# Patient Record
Sex: Female | Born: 1995 | Race: Black or African American | Hispanic: No | Marital: Single | Smoking: Current every day smoker
Health system: Southern US, Community
[De-identification: ages and names within clinical notes are randomized; demographics above are authoritative.]

## PROBLEM LIST (undated history)

## (undated) ENCOUNTER — Inpatient Hospital Stay (HOSPITAL_COMMUNITY): Payer: Self-pay

## (undated) DIAGNOSIS — R7303 Prediabetes: Secondary | ICD-10-CM

## (undated) DIAGNOSIS — K219 Gastro-esophageal reflux disease without esophagitis: Secondary | ICD-10-CM

## (undated) DIAGNOSIS — I10 Essential (primary) hypertension: Secondary | ICD-10-CM

## (undated) HISTORY — PX: NO PAST SURGERIES: SHX2092

---
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
## 2014-05-18 ENCOUNTER — Encounter (HOSPITAL_BASED_OUTPATIENT_CLINIC_OR_DEPARTMENT_OTHER): Payer: Self-pay | Admitting: *Deleted

## 2014-05-18 ENCOUNTER — Emergency Department (HOSPITAL_BASED_OUTPATIENT_CLINIC_OR_DEPARTMENT_OTHER)
Admission: EM | Admit: 2014-05-18 | Discharge: 2014-05-18 | Disposition: A | Discharge status: Home / Self Care | Payer: Medicaid Other | Attending: Emergency Medicine | Admitting: Emergency Medicine

## 2014-05-18 DIAGNOSIS — Z720 Tobacco use: Secondary | ICD-10-CM | POA: Insufficient documentation

## 2014-05-18 DIAGNOSIS — Z3202 Encounter for pregnancy test, result negative: Secondary | ICD-10-CM | POA: Insufficient documentation

## 2014-05-18 DIAGNOSIS — Z8719 Personal history of other diseases of the digestive system: Secondary | ICD-10-CM | POA: Insufficient documentation

## 2014-05-18 DIAGNOSIS — R197 Diarrhea, unspecified: Secondary | ICD-10-CM | POA: Insufficient documentation

## 2014-05-18 DIAGNOSIS — R1013 Epigastric pain: Secondary | ICD-10-CM | POA: Diagnosis not present

## 2014-05-18 DIAGNOSIS — R112 Nausea with vomiting, unspecified: Secondary | ICD-10-CM | POA: Insufficient documentation

## 2014-05-18 HISTORY — DX: Gastro-esophageal reflux disease without esophagitis: K21.9

## 2014-05-18 LAB — URINALYSIS, ROUTINE W REFLEX MICROSCOPIC
GLUCOSE, UA: NEGATIVE mg/dL
Hgb urine dipstick: NEGATIVE
KETONES UR: 15 mg/dL — AB
LEUKOCYTES UA: NEGATIVE
NITRITE: NEGATIVE
Protein, ur: NEGATIVE mg/dL
Specific Gravity, Urine: 1.031 — ABNORMAL HIGH (ref 1.005–1.030)
Urobilinogen, UA: 1 mg/dL (ref 0.0–1.0)
pH: 5.5 (ref 5.0–8.0)

## 2014-05-18 LAB — PREGNANCY, URINE: PREG TEST UR: NEGATIVE

## 2014-05-18 LAB — URINE MICROSCOPIC-ADD ON

## 2014-05-18 MED ORDER — GI COCKTAIL ~~LOC~~
30.0000 mL | Freq: Once | ORAL | Status: AC
  Administered 2014-05-18: 30 mL via ORAL
  Filled 2014-05-18: qty 30

## 2014-05-18 MED ORDER — ONDANSETRON 4 MG PO TBDP
4.0000 mg | ORAL_TABLET | Freq: Once | ORAL | Status: AC
  Administered 2014-05-18: 4 mg via ORAL
  Filled 2014-05-18: qty 1

## 2014-05-18 MED ORDER — LOPERAMIDE HCL 2 MG PO CAPS: 2.0000 mg | ORAL_CAPSULE | Freq: Four times a day (QID) | ORAL | Status: DC | PRN

## 2014-05-18 MED ORDER — ONDANSETRON 4 MG PO TBDP: 4.0000 mg | ORAL_TABLET | Freq: Three times a day (TID) | ORAL | Status: DC | PRN

## 2014-05-18 NOTE — ED Provider Notes (Signed)
TIME SEEN: 10:00 AM  CHIEF COMPLAINT: Nausea, vomiting, diarrhea  HPI: Pt is a 19 y.o. female with history of GERD who presents to the emergency department with nausea, vomiting and diarrhea that started last night. She states her nieces similar symptoms. Has had some mild epigastric discomfort. No fevers or chills. No bloody stool or melena. No dysuria or hematuria. No vaginal bleeding or discharge. No history of abdominal surgery. No recent travel. No recent antibiotic use.  ROS: See HPI Constitutional: no fever  Eyes: no drainage  ENT: no runny nose   Cardiovascular:  no chest pain  Resp: no SOB  GI: vomiting GU: no dysuria Integumentary: no rash  Allergy: no hives  Musculoskeletal: no leg swelling  Neurological: no slurred speech ROS otherwise negative  PAST MEDICAL HISTORY/PAST SURGICAL HISTORY:  Past Medical History  Diagnosis Date  . GERD (gastroesophageal reflux disease)     MEDICATIONS:  Prior to Admission medications   Not on File    ALLERGIES:  No Known Allergies  SOCIAL HISTORY:  History  Substance Use Topics  . Smoking status: Current Every Day Smoker  . Smokeless tobacco: Not on file  . Alcohol Use: No    FAMILY HISTORY: No family history on file.  EXAM: BP 122/99 mmHg  Pulse 109  Temp(Src) 98.2 F (36.8 C) (Oral)  Resp 16  Ht 5\' 3"  (1.6 m)  Wt 211 lb (95.709 kg)  BMI 37.39 kg/m2  SpO2 99%  LMP 04/24/2014 CONSTITUTIONAL: Alert and oriented and responds appropriately to questions. Well-appearing; well-nourished HEAD: Normocephalic EYES: Conjunctivae clear, PERRL ENT: normal nose; no rhinorrhea; moist mucous membranes; pharynx without lesions noted NECK: Supple, no meningismus, no LAD  CARD: RRR; S1 and S2 appreciated; no murmurs, no clicks, no rubs, no gallops RESP: Normal chest excursion without splinting or tachypnea; breath sounds clear and equal bilaterally; no wheezes, no rhonchi, no rales ABD/GI: Normal bowel sounds; non-distended;  soft, non-tender, no rebound, no guarding BACK:  The back appears normal and is non-tender to palpation, there is no CVA tenderness EXT: Normal ROM in all joints; non-tender to palpation; no edema; normal capillary refill; no cyanosis    SKIN: Normal color for age and race; warm NEURO: Moves all extremities equally PSYCH: The patient's mood and manner are appropriate. Grooming and personal hygiene are appropriate.  MEDICAL DECISION MAKING: Patient here with likely viral illness complicated with GERD. On my evaluation heart rate is in the 90s. She is very well-appearing, nontoxic, well-hydrated. We'll give GI cocktail, Zofran. We'll by mouth challenge.  ED PROGRESS: Patient has tolerated by mouth. Heart rate has improved. Urine shows small ketones but she is drinking fluids without difficulty. Urine pregnancy negative. Will discharge home.     Layla MawKristen N Rayman Petrosian, DO 05/18/14 1049

## 2014-05-18 NOTE — ED Notes (Signed)
Pt c/o generalized abd pain with N/V/D that began last night. Pt has been around family member with stomach virus.

## 2014-05-18 NOTE — Discharge Instructions (Signed)

## 2014-10-21 ENCOUNTER — Emergency Department (HOSPITAL_BASED_OUTPATIENT_CLINIC_OR_DEPARTMENT_OTHER)
Admission: EM | Admit: 2014-10-21 | Discharge: 2014-10-21 | Disposition: A | Discharge status: Home / Self Care | Payer: Medicaid Other | Attending: Emergency Medicine | Admitting: Emergency Medicine

## 2014-10-21 ENCOUNTER — Encounter (HOSPITAL_BASED_OUTPATIENT_CLINIC_OR_DEPARTMENT_OTHER): Payer: Self-pay | Admitting: *Deleted

## 2014-10-21 DIAGNOSIS — I10 Essential (primary) hypertension: Secondary | ICD-10-CM | POA: Diagnosis not present

## 2014-10-21 DIAGNOSIS — R300 Dysuria: Secondary | ICD-10-CM | POA: Diagnosis not present

## 2014-10-21 DIAGNOSIS — Z8719 Personal history of other diseases of the digestive system: Secondary | ICD-10-CM | POA: Insufficient documentation

## 2014-10-21 DIAGNOSIS — Z3202 Encounter for pregnancy test, result negative: Secondary | ICD-10-CM | POA: Diagnosis not present

## 2014-10-21 DIAGNOSIS — R0981 Nasal congestion: Secondary | ICD-10-CM | POA: Diagnosis present

## 2014-10-21 DIAGNOSIS — H9210 Otorrhea, unspecified ear: Secondary | ICD-10-CM | POA: Insufficient documentation

## 2014-10-21 DIAGNOSIS — Z720 Tobacco use: Secondary | ICD-10-CM | POA: Diagnosis not present

## 2014-10-21 DIAGNOSIS — J0100 Acute maxillary sinusitis, unspecified: Secondary | ICD-10-CM | POA: Diagnosis not present

## 2014-10-21 HISTORY — DX: Essential (primary) hypertension: I10

## 2014-10-21 HISTORY — DX: Prediabetes: R73.03

## 2014-10-21 LAB — URINE MICROSCOPIC-ADD ON

## 2014-10-21 LAB — URINALYSIS, ROUTINE W REFLEX MICROSCOPIC
BILIRUBIN URINE: NEGATIVE
Glucose, UA: NEGATIVE mg/dL
HGB URINE DIPSTICK: NEGATIVE
Ketones, ur: 15 mg/dL — AB
NITRITE: NEGATIVE
Protein, ur: NEGATIVE mg/dL
Specific Gravity, Urine: 1.027 (ref 1.005–1.030)
Urobilinogen, UA: 0.2 mg/dL (ref 0.0–1.0)
pH: 5.5 (ref 5.0–8.0)

## 2014-10-21 LAB — PREGNANCY, URINE: Preg Test, Ur: NEGATIVE

## 2014-10-21 MED ORDER — AMOXICILLIN 500 MG PO CAPS: 500.0000 mg | ORAL_CAPSULE | Freq: Three times a day (TID) | ORAL | Status: AC

## 2014-10-21 MED ORDER — ACETAMINOPHEN 325 MG PO TABS
650.0000 mg | ORAL_TABLET | Freq: Once | ORAL | Status: AC
  Administered 2014-10-21: 650 mg via ORAL
  Filled 2014-10-21: qty 2

## 2014-10-21 NOTE — ED Notes (Signed)
Pt reports nasal congestion since Wednesday; denies cough, sore throat, v/d. Reports nausea, headache and intermittent runny nose. Reports taking Motrin 400 mg for pain with no relief (last taken on Friday). Also reports dysuria and frequency (denies hematuria).

## 2014-10-21 NOTE — ED Notes (Signed)
Pt states she thinks she has a sinus infection. HA, congestion, no cough, onset this past Tuesday, pt states also having fever

## 2014-10-21 NOTE — Discharge Instructions (Signed)

## 2014-10-21 NOTE — ED Provider Notes (Signed)
CSN: [id number]     Arrival date & time 10/21/14  1637 History   First MD Initiated Contact with Patient 10/21/14 1706     Chief Complaint  Patient presents with  . Nasal Congestion  . Dysuria     (Consider location/radiation/quality/duration/timing/severity/associated sxs/prior Treatment) Patient is a 19 y.o. female presenting with cough. The history is provided by the patient. No language interpreter was used.  Cough Cough characteristics:  Non-productive Severity:  Moderate Onset quality:  Gradual Duration:  3 days Timing:  Constant Progression:  Worsening Chronicity:  New Smoker: no   Context: upper respiratory infection   Relieved by:  Nothing Worsened by:  Nothing tried Ineffective treatments:  None tried Associated symptoms: headaches and rhinorrhea   Associated symptoms: no shortness of breath     Past Medical History  Diagnosis Date  . GERD (gastroesophageal reflux disease)   . Hypertension     states this has resolved  . Prediabetes     states this has resolved   History reviewed. No pertinent past surgical history. No family history on file. History  Substance Use Topics  . Smoking status: Current Every Day Smoker -- 0.33 packs/day    Types: Cigarettes  . Smokeless tobacco: Never Used  . Alcohol Use: No   OB History    No data available     Review of Systems  HENT: Positive for ear discharge, facial swelling and rhinorrhea.   Respiratory: Positive for cough. Negative for shortness of breath.   Neurological: Positive for headaches.  All other systems reviewed and are negative.     Allergies  Review of patient's allergies indicates no known allergies.  Home Medications   Prior to Admission medications   Medication Sig Start Date End Date Taking? Authorizing Provider  loperamide (IMODIUM) 2 MG capsule Take 1 capsule (2 mg total) by mouth 4 (four) times daily as needed for diarrhea or loose stools. 05/18/14  Yes Kristen N Ward, DO  ondansetron  (ZOFRAN ODT) 4 MG disintegrating tablet Take 1 tablet (4 mg total) by mouth every 8 (eight) hours as needed for nausea or vomiting. 05/18/14   Kristen N Ward, DO   BP 116/61 mmHg  Pulse 127  Temp(Src) 103.1 F (39.5 C) (Oral)  Resp 20  Ht 5\' 3"  (1.6 m)  Wt 200 lb (90.719 kg)  BMI 35.44 kg/m2  SpO2 100%  LMP 10/16/2014 Physical Exam  Constitutional: She appears well-developed and well-nourished.  Cardiovascular: Normal rate.   Pulmonary/Chest: Effort normal.  Abdominal: Soft.  Musculoskeletal: Normal range of motion.  Neurological: She is alert.  Skin: Skin is warm.  Psychiatric: She has a normal mood and affect.  Vitals reviewed.   ED Course  Procedures (including critical care time) Labs Review Labs Reviewed  URINALYSIS, ROUTINE W REFLEX MICROSCOPIC (NOT AT [facility])  PREGNANCY, URINE    Imaging Review No results found.   EKG Interpretation None      MDM   Final diagnoses:  Acute maxillary sinusitis, recurrence not specified   Temp decreased, urine is negative  amoxicillian Follow up with your primary for recheck in 2-3 days   Elson AreasLeslie K Tinika Bucknam, PA-C 10/21/14 1920  Gwyneth SproutWhitney Plunkett, MD 10/21/14 2337

## 2015-03-21 ENCOUNTER — Emergency Department (HOSPITAL_BASED_OUTPATIENT_CLINIC_OR_DEPARTMENT_OTHER)
Admission: EM | Admit: 2015-03-21 | Discharge: 2015-03-21 | Disposition: A | Discharge status: Home / Self Care | Payer: Medicaid Other | Attending: Emergency Medicine | Admitting: Emergency Medicine

## 2015-03-21 ENCOUNTER — Encounter (HOSPITAL_BASED_OUTPATIENT_CLINIC_OR_DEPARTMENT_OTHER): Payer: Self-pay | Admitting: Emergency Medicine

## 2015-03-21 DIAGNOSIS — O210 Mild hyperemesis gravidarum: Secondary | ICD-10-CM | POA: Insufficient documentation

## 2015-03-21 DIAGNOSIS — O9989 Other specified diseases and conditions complicating pregnancy, childbirth and the puerperium: Secondary | ICD-10-CM | POA: Insufficient documentation

## 2015-03-21 DIAGNOSIS — Z79899 Other long term (current) drug therapy: Secondary | ICD-10-CM | POA: Insufficient documentation

## 2015-03-21 DIAGNOSIS — R1013 Epigastric pain: Secondary | ICD-10-CM | POA: Diagnosis not present

## 2015-03-21 DIAGNOSIS — Z8719 Personal history of other diseases of the digestive system: Secondary | ICD-10-CM | POA: Insufficient documentation

## 2015-03-21 DIAGNOSIS — Z3A09 9 weeks gestation of pregnancy: Secondary | ICD-10-CM | POA: Diagnosis not present

## 2015-03-21 DIAGNOSIS — Z87891 Personal history of nicotine dependence: Secondary | ICD-10-CM | POA: Diagnosis not present

## 2015-03-21 DIAGNOSIS — Z3490 Encounter for supervision of normal pregnancy, unspecified, unspecified trimester: Secondary | ICD-10-CM

## 2015-03-21 LAB — URINALYSIS, ROUTINE W REFLEX MICROSCOPIC
BILIRUBIN URINE: NEGATIVE
Glucose, UA: NEGATIVE mg/dL
Hgb urine dipstick: NEGATIVE
Ketones, ur: NEGATIVE mg/dL
NITRITE: NEGATIVE
PH: 7 (ref 5.0–8.0)
Protein, ur: NEGATIVE mg/dL
SPECIFIC GRAVITY, URINE: 1.026 (ref 1.005–1.030)

## 2015-03-21 LAB — COMPREHENSIVE METABOLIC PANEL
ALBUMIN: 3.9 g/dL (ref 3.5–5.0)
ALK PHOS: 50 U/L (ref 38–126)
ALT: 13 U/L — ABNORMAL LOW (ref 14–54)
ANION GAP: 6 (ref 5–15)
AST: 17 U/L (ref 15–41)
BUN: 8 mg/dL (ref 6–20)
CALCIUM: 9.2 mg/dL (ref 8.9–10.3)
CHLORIDE: 105 mmol/L (ref 101–111)
CO2: 23 mmol/L (ref 22–32)
Creatinine, Ser: 0.53 mg/dL (ref 0.44–1.00)
GFR calc Af Amer: >60 mL/min (ref >60)
GFR calc non Af Amer: >60 mL/min (ref >60)
GLUCOSE: 97 mg/dL (ref 65–99)
Potassium: 3.8 mmol/L (ref 3.5–5.1)
SODIUM: 134 mmol/L — AB (ref 135–145)
Total Bilirubin: 0.5 mg/dL (ref 0.3–1.2)
Total Protein: 6.8 g/dL (ref 6.5–8.1)

## 2015-03-21 LAB — URINE MICROSCOPIC-ADD ON: RBC / HPF: NONE SEEN RBC/hpf (ref 0–5)

## 2015-03-21 LAB — CBC WITH DIFFERENTIAL/PLATELET
BASOS PCT: 0 %
Basophils Absolute: 0 10*3/uL (ref 0.0–0.1)
EOS ABS: 0.2 10*3/uL (ref 0.0–0.7)
Eosinophils Relative: 1 %
HCT: 35.3 % — ABNORMAL LOW (ref 36.0–46.0)
HEMOGLOBIN: 12.7 g/dL (ref 12.0–15.0)
Lymphocytes Relative: 24 %
Lymphs Abs: 3 10*3/uL (ref 0.7–4.0)
MCH: 29.7 pg (ref 26.0–34.0)
MCHC: 36 g/dL (ref 30.0–36.0)
MCV: 82.7 fL (ref 78.0–100.0)
Monocytes Absolute: 0.9 10*3/uL (ref 0.1–1.0)
Monocytes Relative: 7 %
NEUTROS PCT: 68 %
Neutro Abs: 8.4 10*3/uL — ABNORMAL HIGH (ref 1.7–7.7)
Platelets: 346 10*3/uL (ref 150–400)
RBC: 4.27 MIL/uL (ref 3.87–5.11)
RDW: 11.8 % (ref 11.5–15.5)
WBC: 12.5 10*3/uL — AB (ref 4.0–10.5)

## 2015-03-21 LAB — PREGNANCY, URINE: PREG TEST UR: POSITIVE — AB

## 2015-03-21 LAB — LIPASE, BLOOD: Lipase: 38 U/L (ref 11–51)

## 2015-03-21 MED ORDER — PANTOPRAZOLE SODIUM 40 MG PO TBEC: 40.0000 mg | DELAYED_RELEASE_TABLET | Freq: Every day | ORAL | Status: DC

## 2015-03-21 MED ORDER — PANTOPRAZOLE SODIUM 40 MG PO TBEC
40.0000 mg | DELAYED_RELEASE_TABLET | Freq: Every day | ORAL | Status: DC
  Administered 2015-03-21: 40 mg via ORAL

## 2015-03-21 MED ORDER — PANTOPRAZOLE SODIUM 40 MG PO TBEC
40.0000 mg | DELAYED_RELEASE_TABLET | Freq: Every day | ORAL | Status: DC
  Filled 2015-03-21: qty 1

## 2015-03-21 NOTE — ED Notes (Signed)
Pt is [redacted] weeks pregnant reports epigastric pain that radiates up towards the throat. Says she wants to throw up but can't. A/O NAD at triage.

## 2015-03-21 NOTE — Discharge Instructions (Signed)

## 2015-03-21 NOTE — ED Provider Notes (Signed)
CSN: [id number]     Arrival date & time 03/21/15  0002 History   First MD Initiated Contact with Patient 03/21/15 0016     Chief Complaint  Patient presents with  . Gastroesophageal Reflux     (Consider location/radiation/quality/duration/timing/severity/associated sxs/prior Treatment) HPI  This is a 19 year old G1 P0 female who presents with epigastric pain. Patient reports onset of epigastric pain after eating dinner tonight.  It is sharp and nonradiating. She currently rates her pain at 8 out of 10. She's not taken anything for the pain. She has a history of reflux but feels this is somewhat different. She is currently pregnant. She is followed by her OB and reports normal ultrasound. Denies any lower abdominal pain, loss of fluids, vaginal bleeding. Patient reports daily nausea and vomiting. She's not taking anything for this.  Past Medical History  Diagnosis Date  . GERD (gastroesophageal reflux disease)   . Hypertension     states this has resolved  . Prediabetes     states this has resolved   History reviewed. No pertinent past surgical history. No family history on file. Social History  Substance Use Topics  . Smoking status: Former Smoker -- 0.33 packs/day    Types: Cigarettes  . Smokeless tobacco: Never Used  . Alcohol Use: No   OB History    Gravida Para Term Preterm AB TAB SAB Ectopic Multiple Living   1              Review of Systems  Constitutional: Negative for fever.  Respiratory: Negative for chest tightness and shortness of breath.   Cardiovascular: Negative for chest pain.  Gastrointestinal: Positive for nausea, vomiting and abdominal pain. Negative for diarrhea.  Genitourinary: Negative for dysuria and vaginal bleeding.  Psychiatric/Behavioral: Negative for confusion.  All other systems reviewed and are negative.     Allergies  Review of patient's allergies indicates no known allergies.  Home Medications   Prior to Admission medications    Medication Sig Start Date End Date Taking? Authorizing Provider  Prenatal Vit-Fe Fumarate-FA (PRENATAL MULTIVITAMIN) TABS tablet Take 1 tablet by mouth daily at 12 noon.   Yes Historical Provider, MD  loperamide (IMODIUM) 2 MG capsule Take 1 capsule (2 mg total) by mouth 4 (four) times daily as needed for diarrhea or loose stools. 05/18/14   Kristen N Ward, DO  ondansetron (ZOFRAN ODT) 4 MG disintegrating tablet Take 1 tablet (4 mg total) by mouth every 8 (eight) hours as needed for nausea or vomiting. 05/18/14   Kristen N Ward, DO  pantoprazole (PROTONIX) 40 MG tablet Take 1 tablet (40 mg total) by mouth daily. 03/21/15   Shon Batonourtney F Morgane Joerger, MD   BP 114/61 mmHg  Pulse 75  Temp(Src) 98 F (36.7 C) (Oral)  Resp 16  Ht 5\' 3"  (1.6 m)  Wt 209 lb (94.802 kg)  BMI 37.03 kg/m2  SpO2 100%  LMP 10/16/2014 Physical Exam  Constitutional: She is oriented to person, place, and time. She appears well-developed and well-nourished. No distress.  HENT:  Head: Normocephalic and atraumatic.  Eyes: Pupils are equal, round, and reactive to light.  Cardiovascular: Normal rate, regular rhythm and normal heart sounds.   No murmur heard. Pulmonary/Chest: Effort normal and breath sounds normal. No respiratory distress. She has no wheezes.  Abdominal: Soft. Bowel sounds are normal. There is tenderness. There is no rebound and no guarding.  Tenderness to palpation in the epigastrium without rebound or guarding  Neurological: She is alert and oriented to person,  place, and time.  Skin: Skin is warm and dry.  Psychiatric: She has a normal mood and affect.  Nursing note and vitals reviewed.   ED Course  Procedures (including critical care time) Labs Review Labs Reviewed  CBC WITH DIFFERENTIAL/PLATELET - Abnormal; Notable for the following:    WBC 12.5 (*)    HCT 35.3 (*)    Neutro Abs 8.4 (*)    All other components within normal limits  COMPREHENSIVE METABOLIC PANEL - Abnormal; Notable for the following:     Sodium 134 (*)    ALT 13 (*)    All other components within normal limits  URINALYSIS, ROUTINE W REFLEX MICROSCOPIC (NOT AT [facility]) - Abnormal; Notable for the following:    APPearance CLOUDY (*)    Leukocytes, UA TRACE (*)    All other components within normal limits  PREGNANCY, URINE - Abnormal; Notable for the following:    Preg Test, Ur POSITIVE (*)    All other components within normal limits  URINE MICROSCOPIC-ADD ON - Abnormal; Notable for the following:    Squamous Epithelial / LPF 6-30 (*)    Bacteria, UA MANY (*)    All other components within normal limits  LIPASE, BLOOD    Imaging Review No results found. I have personally reviewed and evaluated these images and lab results as part of my medical decision-making.   EKG Interpretation None      MDM   Final diagnoses:  Pregnancy  Epigastric pain    Patient presents with epigastric pain. Nontoxic on exam. Onset after eating. Considerations include GERD or gallbladder pathology. Basic labwork obtained and patient given Protonix. Patient denies any lower abdominal pain or vaginal bleeding.  Lab work is reassuring. No elevation of LFTs and lipase is normal. Patient was improved with Protonix. Patient does have 0-5 white cells and many bacteria in her urine; however, she is asymptomatic and the specimen appears dirty. We'll have her follow-up with her OB. Suspect GERD.  Will start on Protonix.  After history, exam, and medical workup I feel the patient has been appropriately medically screened and is safe for discharge home. Pertinent diagnoses were discussed with the patient. Patient was given return precautions.     Shon Baton, MD 03/21/15 [phone]

## 2015-08-16 ENCOUNTER — Inpatient Hospital Stay (HOSPITAL_BASED_OUTPATIENT_CLINIC_OR_DEPARTMENT_OTHER)
Admission: EM | Admit: 2015-08-16 | Discharge: 2015-08-16 | Disposition: A | Discharge status: Other Acute Inpatient Hospital | Payer: Medicaid Other | Attending: Obstetrics & Gynecology | Admitting: Obstetrics & Gynecology

## 2015-08-16 ENCOUNTER — Encounter (HOSPITAL_BASED_OUTPATIENT_CLINIC_OR_DEPARTMENT_OTHER): Payer: Self-pay | Admitting: *Deleted

## 2015-08-16 DIAGNOSIS — O212 Late vomiting of pregnancy: Secondary | ICD-10-CM | POA: Diagnosis not present

## 2015-08-16 DIAGNOSIS — Z79899 Other long term (current) drug therapy: Secondary | ICD-10-CM | POA: Insufficient documentation

## 2015-08-16 DIAGNOSIS — K529 Noninfective gastroenteritis and colitis, unspecified: Secondary | ICD-10-CM

## 2015-08-16 DIAGNOSIS — O4703 False labor before 37 completed weeks of gestation, third trimester: Secondary | ICD-10-CM

## 2015-08-16 DIAGNOSIS — O98313 Other infections with a predominantly sexual mode of transmission complicating pregnancy, third trimester: Secondary | ICD-10-CM | POA: Diagnosis not present

## 2015-08-16 DIAGNOSIS — Z87891 Personal history of nicotine dependence: Secondary | ICD-10-CM | POA: Insufficient documentation

## 2015-08-16 DIAGNOSIS — Z3A29 29 weeks gestation of pregnancy: Secondary | ICD-10-CM

## 2015-08-16 DIAGNOSIS — A5901 Trichomonal vulvovaginitis: Secondary | ICD-10-CM | POA: Diagnosis not present

## 2015-08-16 DIAGNOSIS — R1084 Generalized abdominal pain: Secondary | ICD-10-CM | POA: Diagnosis present

## 2015-08-16 DIAGNOSIS — O23593 Infection of other part of genital tract in pregnancy, third trimester: Secondary | ICD-10-CM

## 2015-08-16 LAB — CBC WITH DIFFERENTIAL/PLATELET
BASOS ABS: 0 10*3/uL (ref 0.0–0.1)
BASOS PCT: 0 %
EOS PCT: 2 %
Eosinophils Absolute: 0.2 10*3/uL (ref 0.0–0.7)
HEMATOCRIT: 31.6 % — AB (ref 36.0–46.0)
HEMOGLOBIN: 11.5 g/dL — AB (ref 12.0–15.0)
Lymphocytes Relative: 18 %
Lymphs Abs: 2 10*3/uL (ref 0.7–4.0)
MCH: 31.3 pg (ref 26.0–34.0)
MCHC: 36.4 g/dL — ABNORMAL HIGH (ref 30.0–36.0)
MCV: 86.1 fL (ref 78.0–100.0)
MONO ABS: 0.8 10*3/uL (ref 0.1–1.0)
MONOS PCT: 7 %
NEUTROS ABS: 8.2 10*3/uL — AB (ref 1.7–7.7)
Neutrophils Relative %: 73 %
Platelets: 303 10*3/uL (ref 150–400)
RBC: 3.67 MIL/uL — ABNORMAL LOW (ref 3.87–5.11)
RDW: 12.1 % (ref 11.5–15.5)
WBC: 11.1 10*3/uL — ABNORMAL HIGH (ref 4.0–10.5)

## 2015-08-16 LAB — COMPREHENSIVE METABOLIC PANEL WITH GFR
ALT: 10 U/L — ABNORMAL LOW (ref 14–54)
AST: 18 U/L (ref 15–41)
Albumin: 3.3 g/dL — ABNORMAL LOW (ref 3.5–5.0)
Alkaline Phosphatase: 54 U/L (ref 38–126)
Anion gap: 4 — ABNORMAL LOW (ref 5–15)
BUN: 7 mg/dL (ref 6–20)
CO2: 23 mmol/L (ref 22–32)
Calcium: 8.7 mg/dL — ABNORMAL LOW (ref 8.9–10.3)
Chloride: 106 mmol/L (ref 101–111)
Creatinine, Ser: 0.52 mg/dL (ref 0.44–1.00)
GFR calc Af Amer: >60 mL/min
GFR calc non Af Amer: >60 mL/min
Glucose, Bld: 81 mg/dL (ref 65–99)
Potassium: 3.3 mmol/L — ABNORMAL LOW (ref 3.5–5.1)
Sodium: 133 mmol/L — ABNORMAL LOW (ref 135–145)
Total Bilirubin: 0.6 mg/dL (ref 0.3–1.2)
Total Protein: 6.5 g/dL (ref 6.5–8.1)

## 2015-08-16 LAB — URINALYSIS, ROUTINE W REFLEX MICROSCOPIC
Bilirubin Urine: NEGATIVE
Glucose, UA: NEGATIVE mg/dL
Hgb urine dipstick: NEGATIVE
Ketones, ur: 15 mg/dL — AB
Nitrite: NEGATIVE
Protein, ur: NEGATIVE mg/dL
Specific Gravity, Urine: 1.022 (ref 1.005–1.030)
pH: 7 (ref 5.0–8.0)

## 2015-08-16 LAB — URINE MICROSCOPIC-ADD ON

## 2015-08-16 LAB — FETAL FIBRONECTIN: Fetal Fibronectin: NEGATIVE

## 2015-08-16 MED ORDER — NIFEDIPINE 10 MG PO CAPS
10.0000 mg | ORAL_CAPSULE | Freq: Once | ORAL | Status: AC
  Administered 2015-08-16: 10 mg via ORAL
  Filled 2015-08-16: qty 1

## 2015-08-16 MED ORDER — DEXTROSE IN LACTATED RINGERS 5 % IV SOLN
Freq: Once | INTRAVENOUS | Status: AC
  Administered 2015-08-16: 19:00:00 via INTRAVENOUS
  Filled 2015-08-16: qty 10

## 2015-08-16 MED ORDER — METRONIDAZOLE 500 MG PO TABS
2000.0000 mg | ORAL_TABLET | Freq: Once | ORAL | Status: AC
  Administered 2015-08-16: 2000 mg via ORAL
  Filled 2015-08-16: qty 4

## 2015-08-16 MED ORDER — SODIUM CHLORIDE 0.9 % IV BOLUS (SEPSIS)
1000.0000 mL | Freq: Once | INTRAVENOUS | Status: AC
  Administered 2015-08-16: 1000 mL via INTRAVENOUS

## 2015-08-16 NOTE — ED Notes (Signed)
Pt is 30 weeks preg and c/o lower abd pain and back pain with months of n/v.  And dizziness

## 2015-08-16 NOTE — MAU Provider Note (Signed)
Chief Complaint:  Abdominal Pain   First Provider Initiated Contact with Patient 08/16/15 1758     HPI  Courtney Vargas is a 20 y.o. G1P0 at [id number] presents to maternity admissions reporting contractions and possible leaking of fluid.  States the cramping started today, but has had some pain in her hips for 2 days.   Has also been vomiting "for months", dizzy "for months", and had diarrhea for 4 days.. She reports good fetal movement, denies vaginal bleeding, vaginal itching/burning, urinary symptoms, h/a, dizziness, diarrhea, constipation or fever/chills.  She denies headache, visual changes or RUQ abdominal pain.  Sees [facility] doctors and [facility], [place].  Has not notified her doctors she is here.  RN Note: [facility] Pt was seen by [facility] RN and noted some U/C's and noted some damp panties ER Note: Courtney Vargas is a 20 y.o. female who presents to the Emergency Department complaining of abdominal pain. She is a G1P0 [redacted] weeks pregnant. [facility] and last appointment was 2 days ago. She reports increased lower abdominal cramping and pressure-type sensation since early this morning. Episodes of cramping last about a couple seconds p.m. and radiate to her low back. She has 2 days of increased pain in her right hip. She denies any fevers, dysuria, vaginal discharge, leakage of fluids. She is feeling the baby move. She has had intermittent diarrhea, nausea, vomiting, itchiness throughout the duration of her pregnancy. She feels increased dizziness today compared to previously. She is being followed for elevated blood pressures but is not currently on any blood pressure medications. She has mild lower extremity swelling.  Past Medical History: Past Medical History  Diagnosis Date  . GERD (gastroesophageal reflux disease)   . Hypertension     states this has  resolved  . Prediabetes     states this has resolved    Past obstetric history: OB History  Gravida Para Term Preterm AB SAB TAB Ectopic Multiple Living  1         0    # Outcome Date GA Lbr Len/2nd Weight Sex Delivery Anes PTL Lv  1 Current               Past Surgical History: Past Surgical History  Procedure Laterality Date  . No past surgeries      Family History: History reviewed. No pertinent family history.  Social History: Social History  Substance Use Topics  . Smoking status: Former Smoker -- 0.33 packs/day    Types: Cigarettes  . Smokeless tobacco: Never Used  . Alcohol Use: No    Allergies: No Known Allergies  Meds:  Prescriptions prior to admission  Medication Sig Dispense Refill Last Dose  . calcium carbonate (TUMS - DOSED IN MG ELEMENTAL CALCIUM) 500 MG chewable tablet Chew 2 tablets by mouth 2 (two) times daily as needed for indigestion or heartburn.   08/15/2015 at Unknown time  . Doxylamine-Pyridoxine (DICLEGIS) 10-10 MG TBEC Take 1-2 tablets by mouth 3 (three) times daily.   08/16/2015 at Unknown time  . ondansetron (ZOFRAN) 8 MG tablet Take 8 mg by mouth every 8 (eight) hours as needed for nausea or vomiting.   08/16/2015 at Unknown time  . pantoprazole (PROTONIX) 40 MG tablet Take 1 tablet (40 mg total) by mouth daily. 30 tablet 0 08/16/2015 at Unknown time  . Prenatal Vit-Fe Fumarate-FA (  PRENATAL MULTIVITAMIN) TABS tablet Take 1 tablet by mouth daily at 12 noon.   08/16/2015 at Unknown time  . ondansetron (ZOFRAN ODT) 4 MG disintegrating tablet Take 1 tablet (4 mg total) by mouth every 8 (eight) hours as needed for nausea or vomiting. (Patient not taking: Reported on 08/16/2015) 20 tablet 0 Not Taking at Unknown time    I have reviewed patient's Past Medical Hx, Surgical Hx, Family Hx, Social Hx, medications and allergies.   ROS:  Review of Systems  Constitutional: Negative for fever, chills and fatigue.  Respiratory: Negative for shortness of breath.    Gastrointestinal: Positive for nausea, vomiting, abdominal pain and diarrhea. Negative for constipation.  Genitourinary: Positive for vaginal discharge and pelvic pain. Negative for dysuria, flank pain, vaginal bleeding and difficulty urinating.  Musculoskeletal: Positive for back pain. Negative for myalgias.  Neurological: Positive for light-headedness. Negative for dizziness, syncope and headaches.   Other systems negative  Physical Exam  Patient Vitals for the past 24 hrs:  BP Temp Temp src Pulse Resp SpO2 Height Weight  08/16/15 1746 121/74 mmHg 98.9 F (37.2 C) Oral 84 18 100 % - -  08/16/15 1530 126/85 mmHg - - - 23 - - -  08/16/15 1419 134/77 mmHg 98.2 F (36.8 C) Oral 87 20 100 %  (1.6 m) 105.235 kg (232 lb)   Constitutional: Well-developed, well-nourished female in no acute distress.  Cardiovascular: normal rate and rhythm Respiratory: normal effort, clear to auscultation bilaterally GI: Abd soft, non-tender, gravid appropriate for gestational age.   No rebound or guarding. MS: Extremities nontender, no edema, normal ROM Neurologic: Alert and oriented x 4.  GU: Neg CVAT.  PELVIC EXAM: Cervix pink, visually closed, without lesion, scant white creamy discharge, vaginal walls and external genitalia normal    No pooling, no ferning  Bimanual exam: Cervix firm, posterior, neg CMT, uterus nontender, Fundal Height consistent with dates, adnexa without tenderness, enlargement, or mass Dilation: Closed Effacement (%): 20 Cervical Position: Posterior Station: Ballotable Presentation: Undeterminable Exam by:: Mayford Knife CNM     FHT:  Baseline 140 , moderate variability, accelerations present, no decelerations Contractions:  Irregular every 3-5min   Labs: Results for orders placed or performed during the hospital encounter of 08/16/15 (from the past 24 hour(s))  CBC with Differential     Status: Abnormal   Collection Time: 08/16/15  3:00 PM  Result Value Ref Range   WBC  11.1 (H) 4.0 - 10.5 K/uL   RBC 3.67 (L) 3.87 - 5.11 MIL/uL   Hemoglobin 11.5 (L) 12.0 - 15.0 g/dL   HCT 16.1 (L) 09.6 - 04.5 %   MCV 86.1 78.0 - 100.0 fL   MCH 31.3 26.0 - 34.0 pg   MCHC 36.4 (H) 30.0 - 36.0 g/dL   RDW 40.9 81.1 - 91.4 %   Platelets 303 150 - 400 K/uL   Neutrophils Relative % 73 %   Neutro Abs 8.2 (H) 1.7 - 7.7 K/uL   Lymphocytes Relative 18 %   Lymphs Abs 2.0 0.7 - 4.0 K/uL   Monocytes Relative 7 %   Monocytes Absolute 0.8 0.1 - 1.0 K/uL   Eosinophils Relative 2 %   Eosinophils Absolute 0.2 0.0 - 0.7 K/uL   Basophils Relative 0 %   Basophils Absolute 0.0 0.0 - 0.1 K/uL  Comprehensive metabolic panel     Status: Abnormal   Collection Time: 08/16/15  3:00 PM  Result Value Ref Range   Sodium 133 (L) 135 - 145 mmol/L  Potassium 3.3 (L) 3.5 - 5.1 mmol/L   Chloride 106 101 - 111 mmol/L   CO2 23 22 - 32 mmol/L   Glucose, Bld 81 65 - 99 mg/dL   BUN 7 6 - 20 mg/dL   Creatinine, Ser 1.61 0.44 - 1.00 mg/dL   Calcium 8.7 (L) 8.9 - 10.3 mg/dL   Total Protein 6.5 6.5 - 8.1 g/dL   Albumin 3.3 (L) 3.5 - 5.0 g/dL   AST 18 15 - 41 U/L   ALT 10 (L) 14 - 54 U/L   Alkaline Phosphatase 54 38 - 126 U/L   Total Bilirubin 0.6 0.3 - 1.2 mg/dL   GFR calc non Af Amer >60 >60 mL/min   GFR calc Af Amer >60 >60 mL/min   Anion gap 4 (L) 5 - 15  Urinalysis, Routine w reflex microscopic (not at [facility])     Status: Abnormal   Collection Time: 08/16/15  4:01 PM  Result Value Ref Range   Color, Urine YELLOW YELLOW   APPearance CLEAR CLEAR   Specific Gravity, Urine 1.022 1.005 - 1.030   pH 7.0 5.0 - 8.0   Glucose, UA NEGATIVE NEGATIVE mg/dL   Hgb urine dipstick NEGATIVE NEGATIVE   Bilirubin Urine NEGATIVE NEGATIVE   Ketones, ur 15 (A) NEGATIVE mg/dL   Protein, ur NEGATIVE NEGATIVE mg/dL   Nitrite NEGATIVE NEGATIVE   Leukocytes, UA SMALL (A) NEGATIVE  Urine microscopic-add on     Status: Abnormal   Collection Time: 08/16/15  4:01 PM  Result Value Ref Range   Squamous Epithelial /  LPF 0-5 (A) NONE SEEN   WBC, UA 0-5 0 - 5 WBC/hpf   RBC / HPF 0-5 0 - 5 RBC/hpf   Bacteria, UA MANY (A) NONE SEEN   Urine-Other TRICHOMONAS PRESENT   Fetal fibronectin     Status: None   Collection Time: 08/16/15  6:40 PM  Result Value Ref Range   Fetal Fibronectin NEGATIVE NEGATIVE    Imaging:  No results found.  MAU Course/MDM: I have ordered labs and reviewed results.  NST reviewed   There are contractions, so will send Fetal fibronectin and give Procardia with UCs diminishing after one dose. Flagyl 2gm given for Trich  Treatments in MAU included IV fluids (MVI infusion), Flagyl for trich, and procardia for preterm contractions  Has had a negative FFn and contractions diminished Tolerating crackers and Flagyl with no vomiting  Assessment: [facility] at [redacted]w[redacted]d  Nausea/vomiting (though none observed) and diarrhea (none observed) Probable gastroenteritis Preterm contractions with neg FFn and no change in cervix, resolved with fluids and Procardia Trichomonas, treated  Plan: Discharge home Partner Rx written Preterm Labor precautions and fetal kick counts Follow up in Office for prenatal visits and recheck as needed Pt to call her doctor tomorrow    Medication List    ASK your doctor about these medications        calcium carbonate 500 MG chewable tablet  Commonly known as:  TUMS - dosed in mg elemental calcium  Chew 2 tablets by mouth 2 (two) times daily as needed for indigestion or heartburn.     DICLEGIS 10-10 MG Tbec  Generic drug:  Doxylamine-Pyridoxine  Take 1-2 tablets by mouth 3 (three) times daily.     ondansetron 4 MG disintegrating tablet  Commonly known as:  ZOFRAN ODT  Take 1 tablet (4 mg total) by mouth every 8 (eight) hours as needed for nausea or vomiting.     ondansetron 8 MG tablet  Commonly known as:  ZOFRAN  Take 8 mg by mouth every 8 (eight) hours as needed for nausea or vomiting.     pantoprazole 40 MG tablet  Commonly known as:  PROTONIX   Take 1 tablet (40 mg total) by mouth daily.     prenatal multivitamin Tabs tablet  Take 1 tablet by mouth daily at 12 noon.       Pt stable at time of discharge.  Wynelle Bourgeois CNM, MSN Certified Nurse-Midwife 08/16/2015 5:58 PM

## 2015-08-16 NOTE — Progress Notes (Signed)
[facility], pt is G1P0 [redacted]weeks pregnant, complaining of dizziness, and pressure in abdomen. Placed on monitor. Will call back after we get tracing.

## 2015-08-16 NOTE — ED Provider Notes (Signed)
CSN: [id number]     Arrival date & time 08/16/15  1414 History   First MD Initiated Contact with Patient 08/16/15 1506     Chief Complaint  Patient presents with  . Abdominal Pain     Patient is a 20 y.o. female presenting with abdominal pain. The history is provided by the patient. No language interpreter was used.  Abdominal Pain  Courtney BushSada Cubbage is a 20 y.o. female who presents to the Emergency Department complaining of abdominal pain.  She is a G1P0 [redacted] weeks pregnant.  [facility] for [facility] and last appointment was 2 days ago. She reports increased lower abdominal cramping and pressure-type sensation since early this morning. Episodes of cramping last about a couple seconds p.m. and radiate to her low back. She has 2 days of increased pain in her right hip. She denies any fevers, dysuria, vaginal discharge, leakage of fluids. She is feeling the baby move. She has had intermittent diarrhea, nausea, vomiting, itchiness throughout the duration of her pregnancy. She feels increased dizziness today compared to previously. She is being followed for elevated blood pressures but is not currently on any blood pressure medications. She has mild lower extremity swelling.  Past Medical History  Diagnosis Date  . GERD (gastroesophageal reflux disease)   . Hypertension     states this has resolved  . Prediabetes     states this has resolved   Past Surgical History  Procedure Laterality Date  . No past surgeries     History reviewed. No pertinent family history. Social History  Substance Use Topics  . Smoking status: Former Smoker -- 0.33 packs/day    Types: Cigarettes  . Smokeless tobacco: Never Used  . Alcohol Use: No   OB History    Gravida Para Term Preterm AB TAB SAB Ectopic Multiple Living   1         0     Review of Systems  Gastrointestinal: Positive for abdominal pain.  All other systems reviewed and are negative.     Allergies  Review  of patient's allergies indicates no known allergies.  Home Medications   Prior to Admission medications   Medication Sig Start Date End Date Taking? Authorizing Provider  calcium carbonate (TUMS - DOSED IN MG ELEMENTAL CALCIUM) 500 MG chewable tablet Chew 2 tablets by mouth 2 (two) times daily as needed for indigestion or heartburn.   Yes Historical Provider, MD  Doxylamine-Pyridoxine (DICLEGIS) 10-10 MG TBEC Take 1-2 tablets by mouth 3 (three) times daily. 07/30/15 08/29/15 Yes Historical Provider, MD  ondansetron (ZOFRAN) 8 MG tablet Take 8 mg by mouth every 8 (eight) hours as needed for nausea or vomiting.   Yes Historical Provider, MD  pantoprazole (PROTONIX) 40 MG tablet Take 1 tablet (40 mg total) by mouth daily. 03/21/15  Yes Shon Batonourtney F Horton, MD  Prenatal Vit-Fe Fumarate-FA (PRENATAL MULTIVITAMIN) TABS tablet Take 1 tablet by mouth daily at 12 noon.   Yes Historical Provider, MD   BP 120/51 mmHg  Pulse 92  Temp(Src) 98.9 F (37.2 C) (Oral)  Resp 18  Ht 5\' 3"  (1.6 m)  Wt 232 lb (105.235 kg)  BMI 41.11 kg/m2  SpO2 100%  LMP 01/19/2015 Physical Exam  Constitutional: She is oriented to person, place, and time. She appears well-developed and well-nourished.  HENT:  Head: Normocephalic and atraumatic.  Cardiovascular: Normal rate and regular rhythm.   No murmur heard. Pulmonary/Chest: Effort normal and breath sounds normal. No respiratory  distress.  Abdominal: Soft.  Gravid uterus.  Mild diffuse lower abdominal tenderness.   Musculoskeletal: She exhibits no tenderness.  Trace nonpitting edema in BLE.    Neurological: She is alert and oriented to person, place, and time.  Skin: Skin is warm and dry.  Psychiatric: She has a normal mood and affect. Her behavior is normal.  Nursing note and vitals reviewed.   ED Course  Procedures (including critical care time) Labs Review Labs Reviewed  CBC WITH DIFFERENTIAL/PLATELET - Abnormal; Notable for the following:    WBC 11.1 (*)     RBC 3.67 (*)    Hemoglobin 11.5 (*)    HCT 31.6 (*)    MCHC 36.4 (*)    Neutro Abs 8.2 (*)    All other components within normal limits  COMPREHENSIVE METABOLIC PANEL - Abnormal; Notable for the following:    Sodium 133 (*)    Potassium 3.3 (*)    Calcium 8.7 (*)    Albumin 3.3 (*)    ALT 10 (*)    Anion gap 4 (*)    All other components within normal limits  URINALYSIS, ROUTINE W REFLEX MICROSCOPIC (NOT AT [facility]) - Abnormal; Notable for the following:    Ketones, ur 15 (*)    Leukocytes, UA SMALL (*)    All other components within normal limits  URINE MICROSCOPIC-ADD ON - Abnormal; Notable for the following:    Squamous Epithelial / LPF 0-5 (*)    Bacteria, UA MANY (*)    All other components within normal limits  FETAL FIBRONECTIN  POCT FERN TEST    Imaging Review No results found. I have personally reviewed and evaluated these images and lab results as part of my medical decision-making.   EKG Interpretation   Date/Time:  Thursday August 16 2015 14:38:09 EDT Ventricular Rate:  82 PR Interval:  121 QRS Duration: 90 QT Interval:  378 QTC Calculation: 441 R Axis:   51 Text Interpretation:  Sinus rhythm Borderline repolarization abnormality  Borderline ST elevation, lateral leads, early repolarization Confirmed by  Lincoln Brigham [phone]) on 08/16/2015 2:55:17 PM      MDM   Final diagnoses:  Preterm contractions, third trimester  Gastroenteritis  Trichomonal vaginitis during pregnancy in third trimester   Patient is [redacted] weeks pregnant here with abdominal cramping.  When patient was undressed for further exam she noticed that her pants and underpants were wet and felt a lot of fluid come out - she is unsure if she urinated or not.  Per Toco pt with contractions.  D/w Dr. Katrinka Blazing At [facility], recommends transfer to [facility] for further evaluation.    Tilden Fossa, MD 08/16/15 [phone]

## 2015-08-16 NOTE — MAU Note (Signed)
[facility].   Pt was seen by ROBB RN and noted some U/C's and noted some damp panties.

## 2015-08-16 NOTE — ED Notes (Signed)
Patient ambulatory to the restroom. Placed back on the Monitor

## 2015-08-16 NOTE — Progress Notes (Signed)
Spoke with dr Luberta Robertsonharrarway smith, informed on pt status, informed of fhr tracing, reactive and reassuring,  occaisional contraction. OB cleared, pt may come off monitor. Called Tresa EndoKelly RN at med center, pt may be taken off monitor.

## 2015-08-16 NOTE — Discharge Instructions (Signed)
Viral Gastroenteritis °Viral gastroenteritis is also known as stomach flu. This condition affects the stomach and intestinal tract. It can cause sudden diarrhea and vomiting. The illness typically lasts 3 to 8 days. Most people develop an immune response that eventually gets rid of the virus. While this natural response develops, the virus can make you quite ill. °CAUSES  °Many different viruses can cause gastroenteritis, such as rotavirus or noroviruses. You can catch one of these viruses by consuming contaminated food or water. You may also catch a virus by sharing utensils or other personal items with an infected person or by touching a contaminated surface. °SYMPTOMS  °The most common symptoms are diarrhea and vomiting. These problems can cause a severe loss of body fluids (dehydration) and a body salt (electrolyte) imbalance. Other symptoms may include: °· Fever. °· Headache. °· Fatigue. °· Abdominal pain. °DIAGNOSIS  °Your caregiver can usually diagnose viral gastroenteritis based on your symptoms and a physical exam. A stool sample may also be taken to test for the presence of viruses or other infections. °TREATMENT  °This illness typically goes away on its own. Treatments are aimed at rehydration. The most serious cases of viral gastroenteritis involve vomiting so severely that you are not able to keep fluids down. In these cases, fluids must be given through an intravenous line (IV). °HOME CARE INSTRUCTIONS  °· Drink enough fluids to keep your urine clear or pale yellow. Drink small amounts of fluids frequently and increase the amounts as tolerated. °· Ask your caregiver for specific rehydration instructions. °· Avoid: °¨ Foods high in sugar. °¨ Alcohol. °¨ Carbonated drinks. °¨ Tobacco. °¨ Juice. °¨ Caffeine drinks. °¨ Extremely hot or cold fluids. °¨ Fatty, greasy foods. °¨ Too much intake of anything at one time. °¨ Dairy products until 24 to 48 hours after diarrhea stops. °· You may consume probiotics.  Probiotics are active cultures of beneficial bacteria. They may lessen the amount and number of diarrheal stools in adults. Probiotics can be found in yogurt with active cultures and in supplements. °· Wash your hands well to avoid spreading the virus. °· Only take over-the-counter or prescription medicines for pain, discomfort, or fever as directed by your caregiver. Do not give aspirin to children. Antidiarrheal medicines are not recommended. °· Ask your caregiver if you should continue to take your regular prescribed and over-the-counter medicines. °· Keep all follow-up appointments as directed by your caregiver. °SEEK IMMEDIATE MEDICAL CARE IF:  °· You are unable to keep fluids down. °· You do not urinate at least once every 6 to 8 hours. °· You develop shortness of breath. °· You notice blood in your stool or vomit. This may look like coffee grounds. °· You have abdominal pain that increases or is concentrated in one small area (localized). °· You have persistent vomiting or diarrhea. °· You have a fever. °· The patient is a child younger than 3 months, and he or she has a fever. °· The patient is a child older than 3 months, and he or she has a fever and persistent symptoms. °· The patient is a child older than 3 months, and he or she has a fever and symptoms suddenly get worse. °· The patient is a baby, and he or she has no tears when crying. °MAKE SURE YOU:  °· Understand these instructions. °· Will watch your condition. °· Will get help right away if you are not doing well or get worse. °  °This information is not intended to replace   advice given to you by your health care provider. Make sure you discuss any questions you have with your health care provider.   Document Released: 04/07/2005 Document Revised: 06/30/2011 Document Reviewed: 01/22/2011 Elsevier Interactive Patient Education 2016 [employer] Trichomoniasis Trichomoniasis is an infection caused by an organism called Trichomonas. The  infection can affect both women and men. In women, the outer female genitalia and the vagina are affected. In men, the penis is mainly affected, but the prostate and other reproductive organs can also be involved. Trichomoniasis is a sexually transmitted infection (STI) and is most often passed to another person through sexual contact.  RISK FACTORS  Having unprotected sexual intercourse.  Having sexual intercourse with an infected partner. SIGNS AND SYMPTOMS  Symptoms of trichomoniasis in women include:  Abnormal gray-green frothy vaginal discharge.  Itching and irritation of the vagina.  Itching and irritation of the area outside the vagina. Symptoms of trichomoniasis in men include:   Penile discharge with or without pain.  Pain during urination. This results from inflammation of the urethra. DIAGNOSIS  Trichomoniasis may be found during a Pap test or physical exam. Your health care provider may use one of the following methods to help diagnose this infection:  Testing the pH of the vagina with a test tape.  Using a vaginal swab test that checks for the Trichomonas organism. A test is available that provides results within a few minutes.  Examining a urine sample.  Testing vaginal secretions. Your health care provider may test you for other STIs, including HIV. TREATMENT   You may be given medicine to fight the infection. Women should inform their health care provider if they could be or are pregnant. Some medicines used to treat the infection should not be taken during pregnancy.  Your health care provider may recommend over-the-counter medicines or creams to decrease itching or irritation.  Your sexual partner will need to be treated if infected.  Your health care provider may test you for infection again 3 months after treatment. HOME CARE INSTRUCTIONS   Take medicines only as directed by your health care provider.  Take over-the-counter medicine for itching or  irritation as directed by your health care provider.  Do not have sexual intercourse while you have the infection.  Women should not douche or wear tampons while they have the infection.  Discuss your infection with your partner. Your partner may have gotten the infection from you, or you may have gotten it from your partner.  Have your sex partner get examined and treated if necessary.  Practice safe, informed, and protected sex.  See your health care provider for other STI testing. SEEK MEDICAL CARE IF:   You still have symptoms after you finish your medicine.  You develop abdominal pain.  You have pain when you urinate.  You have bleeding after sexual intercourse.  You develop a rash.  Your medicine makes you sick or makes you throw up (vomit). MAKE SURE YOU:  Understand these instructions.  Will watch your condition.  Will get help right away if you are not doing well or get worse.   This information is not intended to replace advice given to you by your health care provider. Make sure you discuss any questions you have with your health care provider.   Document Released: 10/01/2000 Document Revised: 04/28/2014 Document Reviewed: 01/17/2013 Elsevier Interactive Patient Education [employer] Elsevier Inc.

## 2015-08-16 NOTE — Progress Notes (Signed)
Before tracing was discontinued, more contractions where noted, called Courtney ListenKelly RN to keep Courtney Vargas on monitor. Per Tresa EndoKelly RN they are going to check Courtney Vargas, as she now has some leaking. Spoke with dr Lowry Ramharaway smith and updated her on Courtney Vargas status, if Courtney Vargas needs further evaluation to rule out ROM, transfer to womens. Will [facility] at [facility].

## 2015-08-16 NOTE — ED Notes (Addendum)
Patient took pants off to be prepared for a Cervix check by EDP - when pants taken off patient states "I feel like I have fluid leaking"  - panties are slightly wet. MD aware

## 2015-09-14 ENCOUNTER — Inpatient Hospital Stay (HOSPITAL_COMMUNITY): Payer: No Typology Code available for payment source

## 2015-09-14 ENCOUNTER — Inpatient Hospital Stay (HOSPITAL_BASED_OUTPATIENT_CLINIC_OR_DEPARTMENT_OTHER)
Admission: EM | Admit: 2015-09-14 | Discharge: 2015-09-14 | Disposition: A | Discharge status: Other Acute Inpatient Hospital | Payer: No Typology Code available for payment source | Attending: Family Medicine | Admitting: Family Medicine

## 2015-09-14 ENCOUNTER — Encounter (HOSPITAL_BASED_OUTPATIENT_CLINIC_OR_DEPARTMENT_OTHER): Payer: Self-pay | Admitting: Emergency Medicine

## 2015-09-14 DIAGNOSIS — R1031 Right lower quadrant pain: Secondary | ICD-10-CM | POA: Diagnosis not present

## 2015-09-14 DIAGNOSIS — Y939 Activity, unspecified: Secondary | ICD-10-CM | POA: Insufficient documentation

## 2015-09-14 DIAGNOSIS — O9A213 Injury, poisoning and certain other consequences of external causes complicating pregnancy, third trimester: Secondary | ICD-10-CM | POA: Insufficient documentation

## 2015-09-14 DIAGNOSIS — Y92410 Unspecified street and highway as the place of occurrence of the external cause: Secondary | ICD-10-CM | POA: Diagnosis not present

## 2015-09-14 DIAGNOSIS — Z041 Encounter for examination and observation following transport accident: Secondary | ICD-10-CM

## 2015-09-14 DIAGNOSIS — M545 Low back pain: Secondary | ICD-10-CM | POA: Insufficient documentation

## 2015-09-14 DIAGNOSIS — R51 Headache: Secondary | ICD-10-CM | POA: Diagnosis not present

## 2015-09-14 DIAGNOSIS — Z3A34 34 weeks gestation of pregnancy: Secondary | ICD-10-CM | POA: Diagnosis not present

## 2015-09-14 DIAGNOSIS — R197 Diarrhea, unspecified: Secondary | ICD-10-CM | POA: Insufficient documentation

## 2015-09-14 DIAGNOSIS — O219 Vomiting of pregnancy, unspecified: Secondary | ICD-10-CM | POA: Diagnosis not present

## 2015-09-14 DIAGNOSIS — Z79899 Other long term (current) drug therapy: Secondary | ICD-10-CM | POA: Diagnosis not present

## 2015-09-14 DIAGNOSIS — Y999 Unspecified external cause status: Secondary | ICD-10-CM | POA: Diagnosis not present

## 2015-09-14 DIAGNOSIS — Z87891 Personal history of nicotine dependence: Secondary | ICD-10-CM | POA: Diagnosis not present

## 2015-09-14 DIAGNOSIS — I10 Essential (primary) hypertension: Secondary | ICD-10-CM | POA: Diagnosis not present

## 2015-09-14 DIAGNOSIS — T1490 Injury, unspecified: Secondary | ICD-10-CM

## 2015-09-14 DIAGNOSIS — Z3493 Encounter for supervision of normal pregnancy, unspecified, third trimester: Secondary | ICD-10-CM

## 2015-09-14 LAB — URINALYSIS, ROUTINE W REFLEX MICROSCOPIC
Bilirubin Urine: NEGATIVE
GLUCOSE, UA: NEGATIVE mg/dL
Hgb urine dipstick: NEGATIVE
KETONES UR: NEGATIVE mg/dL
LEUKOCYTES UA: NEGATIVE
NITRITE: NEGATIVE
PH: 7 (ref 5.0–8.0)
Protein, ur: NEGATIVE mg/dL
SPECIFIC GRAVITY, URINE: 1.02 (ref 1.005–1.030)
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
## 2015-09-14 NOTE — MAU Note (Signed)
[facility] Alvester [facility]Newton [facility] around 1[facility]00 pm [facility] and told to come here for monitoring and further evaluation, patient on monitor fhr reassuring, one UC noted thus far.

## 2015-09-14 NOTE — ED Notes (Signed)
Pt is [redacted] weeks pregnant and is complaining of lower abdominal pressure

## 2015-09-14 NOTE — ED Notes (Signed)
  Received call from rapid response at MAU, montoring looked good, no problems with baby hr and no contractions noted, she will contact dr. Shawnie PonsPratt to discuss if pt should be transferred for 4 hour monitoring or if she can d/c monitoring.
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
## 2015-09-14 NOTE — ED Notes (Signed)
MD at bedside. 

## 2015-09-14 NOTE — MAU Note (Signed)
Diet ordered, pepperoni pizza, ceasar side salad, apple cobbler, and sprite.
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
## 2015-09-14 NOTE — ED Notes (Signed)
Notified rapid response at [facility] of patients complaints, toco monitor and fetal heart rate monitor in place.
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
## 2015-09-15 LAB — ABO/RH: ABO/RH(D): B POS

## 2015-10-22 ENCOUNTER — Inpatient Hospital Stay (HOSPITAL_COMMUNITY)
Admission: AD | Admit: 2015-10-22 | Discharge: 2015-10-25 | DRG: 766 | Disposition: A | Discharge status: Home / Self Care | Payer: Medicaid Other | Source: Ambulatory Visit | Attending: Obstetrics and Gynecology | Admitting: Obstetrics and Gynecology

## 2015-10-22 ENCOUNTER — Inpatient Hospital Stay (HOSPITAL_COMMUNITY): Payer: Medicaid Other | Admitting: Anesthesiology

## 2015-10-22 ENCOUNTER — Encounter (HOSPITAL_COMMUNITY): Payer: Self-pay | Admitting: *Deleted

## 2015-10-22 DIAGNOSIS — O4292 Full-term premature rupture of membranes, unspecified as to length of time between rupture and onset of labor: Secondary | ICD-10-CM | POA: Diagnosis present

## 2015-10-22 DIAGNOSIS — Z87891 Personal history of nicotine dependence: Secondary | ICD-10-CM

## 2015-10-22 DIAGNOSIS — O9962 Diseases of the digestive system complicating childbirth: Secondary | ICD-10-CM | POA: Diagnosis present

## 2015-10-22 DIAGNOSIS — Z3A39 39 weeks gestation of pregnancy: Secondary | ICD-10-CM

## 2015-10-22 DIAGNOSIS — O4290 Premature rupture of membranes, unspecified as to length of time between rupture and onset of labor, unspecified weeks of gestation: Secondary | ICD-10-CM | POA: Diagnosis present

## 2015-10-22 LAB — CBC
HCT: 30.6 % — ABNORMAL LOW (ref 36.0–46.0)
Hemoglobin: 11.3 g/dL — ABNORMAL LOW (ref 12.0–15.0)
MCH: 30.6 pg (ref 26.0–34.0)
MCHC: 36.9 g/dL — AB (ref 30.0–36.0)
MCV: 82.9 fL (ref 78.0–100.0)
PLATELETS: 259 10*3/uL (ref 150–400)
RBC: 3.69 MIL/uL — ABNORMAL LOW (ref 3.87–5.11)
RDW: 12.7 % (ref 11.5–15.5)
WBC: 12.4 10*3/uL — ABNORMAL HIGH (ref 4.0–10.5)

## 2015-10-22 LAB — TYPE AND SCREEN
ABO/RH(D): B POS
Antibody Screen: NEGATIVE

## 2015-10-22 LAB — POCT FERN TEST: POCT Fern Test: POSITIVE

## 2015-10-22 MED ORDER — TERBUTALINE SULFATE 1 MG/ML IJ SOLN: 0.2500 mg | Freq: Once | INTRAMUSCULAR | Status: DC | PRN

## 2015-10-22 MED ORDER — ONDANSETRON HCL 4 MG/2ML IJ SOLN
4.0000 mg | Freq: Four times a day (QID) | INTRAMUSCULAR | Status: DC | PRN
  Administered 2015-10-23: 4 mg via INTRAVENOUS

## 2015-10-22 MED ORDER — LIDOCAINE HCL (PF) 1 % IJ SOLN: 30.0000 mL | INTRAMUSCULAR | Status: DC | PRN

## 2015-10-22 MED ORDER — PHENYLEPHRINE 40 MCG/ML (10ML) SYRINGE FOR IV PUSH (FOR BLOOD PRESSURE SUPPORT)
80.0000 ug | PREFILLED_SYRINGE | INTRAVENOUS | Status: DC | PRN
  Filled 2015-10-22: qty 10

## 2015-10-22 MED ORDER — EPHEDRINE 5 MG/ML INJ: 10.0000 mg | INTRAVENOUS | Status: DC | PRN

## 2015-10-22 MED ORDER — LACTATED RINGERS IV SOLN
INTRAVENOUS | Status: DC
  Administered 2015-10-22 – 2015-10-23 (×5): via INTRAVENOUS

## 2015-10-22 MED ORDER — LACTATED RINGERS IV SOLN: 500.0000 mL | INTRAVENOUS | Status: DC | PRN

## 2015-10-22 MED ORDER — OXYCODONE-ACETAMINOPHEN 5-325 MG PO TABS: 2.0000 | ORAL_TABLET | ORAL | Status: DC | PRN

## 2015-10-22 MED ORDER — LACTATED RINGERS IV SOLN
500.0000 mL | Freq: Once | INTRAVENOUS | Status: AC
  Administered 2015-10-22: 500 mL via INTRAVENOUS

## 2015-10-22 MED ORDER — OXYCODONE-ACETAMINOPHEN 5-325 MG PO TABS: 1.0000 | ORAL_TABLET | ORAL | Status: DC | PRN

## 2015-10-22 MED ORDER — OXYTOCIN 40 UNITS IN LACTATED RINGERS INFUSION - SIMPLE MED: 2.5000 [IU]/h | INTRAVENOUS | Status: DC

## 2015-10-22 MED ORDER — LIDOCAINE HCL (PF) 1 % IJ SOLN
INTRAMUSCULAR | Status: DC | PRN
  Administered 2015-10-22 (×2): 5 mL

## 2015-10-22 MED ORDER — SOD CITRATE-CITRIC ACID 500-334 MG/5ML PO SOLN
30.0000 mL | ORAL | Status: DC | PRN
  Administered 2015-10-23: 30 mL via ORAL
  Filled 2015-10-22: qty 15

## 2015-10-22 MED ORDER — ACETAMINOPHEN 325 MG PO TABS: 650.0000 mg | ORAL_TABLET | ORAL | Status: DC | PRN

## 2015-10-22 MED ORDER — OXYTOCIN BOLUS FROM INFUSION: 500.0000 mL | INTRAVENOUS | Status: DC

## 2015-10-22 MED ORDER — FENTANYL 2.5 MCG/ML BUPIVACAINE 1/10 % EPIDURAL INFUSION (WH - ANES)
14.0000 mL/h | INTRAMUSCULAR | Status: DC | PRN
  Administered 2015-10-22 – 2015-10-23 (×3): 14 mL/h via EPIDURAL
  Filled 2015-10-22 (×3): qty 125

## 2015-10-22 MED ORDER — DIPHENHYDRAMINE HCL 50 MG/ML IJ SOLN
12.5000 mg | INTRAMUSCULAR | Status: DC | PRN
  Administered 2015-10-22: 12.5 mg via INTRAVENOUS
  Filled 2015-10-22: qty 1

## 2015-10-22 MED ORDER — FENTANYL CITRATE (PF) 100 MCG/2ML IJ SOLN: 100.0000 ug | INTRAMUSCULAR | Status: DC | PRN

## 2015-10-22 MED ORDER — OXYTOCIN 40 UNITS IN LACTATED RINGERS INFUSION - SIMPLE MED
1.0000 m[IU]/min | INTRAVENOUS | Status: DC
  Administered 2015-10-22: 1 m[IU]/min via INTRAVENOUS
  Filled 2015-10-22: qty 1000

## 2015-10-22 NOTE — Anesthesia Procedure Notes (Addendum)
Epidural Patient location during procedure: OB  Staffing Anesthesiologist: Phillips GroutARIGNAN, PETER Performed by: anesthesiologist   Preanesthetic Checklist Completed: patient identified, site marked, surgical consent, pre-op evaluation, timeout performed, IV checked, risks and benefits discussed and monitors and equipment checked  Epidural Patient position: sitting Prep: DuraPrep Patient monitoring: heart rate, continuous pulse ox and blood pressure Approach: right paramedian Location: L3-L4 Injection technique: LOR saline  Needle:  Needle type: Tuohy  Needle gauge: 17 G Needle length: 9 cm and 9 Needle insertion depth: 7 cm Catheter type: closed end flexible Catheter size: 20 Guage Catheter at skin depth: 11 cm Test dose: negative  Assessment Events: blood not aspirated, injection not painful, no injection resistance, negative IV test and no paresthesia  Additional Notes Patient identified. Risks/Benefits/Options discussed with patient including but not limited to bleeding, infection, nerve damage, paralysis, failed block, incomplete pain control, headache, blood pressure changes, nausea, vomiting, reactions to medication both or allergic, itching and postpartum back pain. Confirmed with bedside nurse the patient's most recent platelet count. Confirmed with patient that they are not currently taking any anticoagulation, have any bleeding history or any family history of bleeding disorders. Patient expressed understanding and wished to proceed. All questions were answered. Sterile technique was used throughout the entire procedure. Please see nursing notes for vital signs. Test dose was given through epidural needle and negative prior to continuing to dose epidural or start infusion. Warning signs of high block given to the patient including shortness of breath, tingling/numbness in hands, complete motor block, or any concerning symptoms with instructions to call for help. Patient was given  instructions on fall risk and not to get out of bed. All questions and concerns addressed with instructions to call with any issues.  Spinal Patient location during procedure: OR Start time: 10/23/2015 10:57 AM Staffing Anesthesiologist: Mal AmabileFOSTER, Hildur Bayer Performed by: anesthesiologist  Preanesthetic Checklist Completed: patient identified, site marked, surgical consent, pre-op evaluation, timeout performed, IV checked, risks and benefits discussed and monitors and equipment checked Spinal Block Patient position: sitting Prep: site prepped and draped and DuraPrep Patient monitoring: heart rate, cardiac monitor, continuous pulse ox and blood pressure Approach: midline Location: L3-4 Injection technique: single-shot Needle Needle type: Pencan  Needle gauge: 24 G Needle length: 9 cm Needle insertion depth: 8 cm Assessment Sensory level: T2 Additional Notes Patient tolerated procedure well. Adequate sensory level.

## 2015-10-22 NOTE — MAU Note (Signed)
Pt presents to MAU via EMS with complaints of contractions that started around 10 this morning. PT states [place] when she started having contractions. Denies any vaginal bleeding or LOF

## 2015-10-22 NOTE — H&P (Signed)
LABOR ADMISSION HISTORY AND PHYSICAL  Courtney Vargas is a 20 y.o. female G1P0 with IUP at 5267w3d by LMP (US CWD) presenting for SROM at 11AM, clear; fern positive. She reports +FM, + contractions, no VB, no blurry vision, headaches or peripheral edema, and RUQ pain.  She plans on breastfeeding. She is undecided about birth control. Pt received care [facility] [place] [facility] [place] and then [facility] to [facility]  Dating: By LMP (US CWD) --->  Estimated Date of Delivery: 10/26/15  Sono:    U/S @ 20+ WKS: + ACTIVITY, CX 3.8 CM LONG & CLOSED, GR 0 POST HIGH PLAC, FLUID WNL, MEAS C/W DATES, NO ABNL NOTED HOWEVER FACE SUBOPTIMAL DUE TO FETAL POSITION, WOULD LIKE TO RECHECK @ 26-28 WKS - CLB   U/S @ 32+ WKS: + ACTIVITY, VTX PRES, CX 3.4 CM LONG & CLOSED, GR 2 POST PLAC, AFI 11+ CM WNL, MEAS C/W DATES, EFW 4'3" (40%ILE), NO ABNL NOTED - CLB    Prenatal History/Complications:  Past Medical History: Past Medical History  Diagnosis Date  . GERD (gastroesophageal reflux disease)   . Hypertension     states this has resolved  . Prediabetes     states this has resolved    Past Surgical History: Past Surgical History  Procedure Laterality Date  . No past surgeries      Obstetrical History: OB History    Gravida Para Term Preterm AB TAB SAB Ectopic Multiple Living   1         0      Social History: Social History   Social History  . Marital Status: Single    Spouse Name: N/A  . Number of Children: N/A  . Years of Education: N/A   Social History Main Topics  . Smoking status: Former Smoker -- 0.33 packs/day    Types: Cigarettes  . Smokeless tobacco: Never Used  . Alcohol Use: No  . Drug Use: No  . Sexual Activity: Yes   Other Topics Concern  . None   Social History Narrative    Family History: Family History  Problem Relation Age of Onset  . Hypertension Mother   . Diabetes Maternal Grandmother   . Hypertension Maternal Grandmother   . Diabetes Maternal Grandfather    . Hypertension Maternal Grandfather   . Diabetes Paternal Grandmother   . Hypertension Paternal Grandmother   . Diabetes Paternal Grandfather   . Hypertension Paternal Grandfather     Allergies: No Known Allergies  Prescriptions prior to admission  Medication Sig Dispense Refill Last Dose  . calcium carbonate (TUMS - DOSED IN MG ELEMENTAL CALCIUM) 500 MG chewable tablet Chew 2 tablets by mouth 2 (two) times daily as needed for indigestion or heartburn.   Past Month at Unknown time  . Prenatal Vit-Fe Fumarate-FA (PRENATAL MULTIVITAMIN) TABS tablet Take 1 tablet by mouth daily at 12 noon.   10/22/2015 at Unknown time     Review of Systems   All systems reviewed and negative except as stated in HPI  BP 131/86 mmHg  Pulse 98  Temp(Src) 98.3 F (36.8 C) (Oral)  Resp 16  Ht 5\' 3"  (1.6 m)  Wt 113.399 kg (250 lb)  BMI 44.30 kg/m2  SpO2 98%  LMP 01/19/2015 General appearance: alert and no distress Lungs: clear to auscultation bilaterally Heart: regular rate and rhythm Abdomen: soft, non-tender; bowel sounds normal Extremities: Homans sign is negative, no sign of DVT, edema DTR's unable to elicit patellar or achilles  Fetal monitoring:  Baseline: 135 bpm, Variability: Good {> 6 bpm), Accelerations: Reactive and Decelerations: Variable ~3 with shoulders Uterine activity: 4-1016mins Dilation: 1.5 Effacement (%): 90 Station: -2 Exam by:: Lurline HareS. Carrera,RNC   Prenatal labs: ABO, Rh: --/--/B POS (07/03 1855) Antibody: PENDING (07/03 1855) Rubella: immune RPR:    HBsAg:    HIV:   NR GBS:    1 hr Glucola 84 Genetic screening  Normal QUAD Anatomy US: wnl U/S @ 20+ WKS: + ACTIVITY, CX 3.8 CM LONG & CLOSED, GR 0 POST HIGH PLAC, FLUID WNL, MEAS C/W DATES, NO ABNL NOTED HOWEVER FACE SUBOPTIMAL DUE TO FETAL POSITION, WOULD LIKE TO RECHECK @ 26-28 WKS - CLB   U/S @ 32+ WKS: + ACTIVITY, VTX PRES, CX 3.4 CM LONG & CLOSED, GR 2 POST PLAC, AFI 11+ CM WNL, MEAS C/W DATES, EFW 4'3" (40%ILE), NO  ABNL NOTED - CLB    Prenatal Transfer Tool  Maternal Diabetes: No Genetic Screening: Normal quad Maternal Ultrasounds/Referrals: Normal Fetal Ultrasounds or other Referrals:  None Maternal Substance Abuse:  Yes:  Type: Marijuana Significant Maternal Medications:  None Significant Maternal Lab Results: Lab values include: Group B Strep negative  Results for orders placed or performed during the hospital encounter of 10/22/15 (from the past 24 hour(s))  CBC   Collection Time: 10/22/15  6:55 PM  Result Value Ref Range   WBC 12.4 (H) 4.0 - 10.5 K/uL   RBC 3.69 (L) 3.87 - 5.11 MIL/uL   Hemoglobin 11.3 (L) 12.0 - 15.0 g/dL   HCT 95.230.6 (L) 84.136.0 - 32.446.0 %   MCV 82.9 78.0 - 100.0 fL   MCH 30.6 26.0 - 34.0 pg   MCHC 36.9 (H) 30.0 - 36.0 g/dL   RDW 40.112.7 02.711.5 - 25.315.5 %   Platelets 259 150 - 400 K/uL  Type and screen [facility]   Collection Time: 10/22/15  6:55 PM  Result Value Ref Range   ABO/RH(D) B POS    Antibody Screen PENDING    Sample Expiration 10/25/2015   Fern Test   Collection Time: 10/22/15  7:07 PM  Result Value Ref Range   POCT Fern Test Positive = ruptured amniotic membanes     Patient Active Problem List   Diagnosis Date Noted  . PROM (premature rupture of membranes) 10/22/2015    Assessment: Courtney Vargas is a 20 y.o. G1P0 at 6960w3d here for SROM at 11AM, fern positive  #Labor: initially progressing but contractions seem to have become less frequent. Started Pitocin at 2300 #Pain: Epidural #FWB:  Cat 1  #ID: GBS negative  #MOF: breast  #MOC: undecided #Circ: outpatient   Palma HolterKanishka G Gunadasa, MD PGY 2 Family Medicine  OB fellow attestation:  I have seen and examined this patient; I agree with above documentation in the resident's note.   Courtney Vargas is a 20 y.o. G1P0 here for PROM  PE: BP 126/73 mmHg  Pulse 107  Temp(Src) 97.8 F (36.6 C) (Axillary)  Resp 16  Ht 5\' 3"  (1.6 m)  Wt 113.399 kg (250 lb)  BMI 44.30 kg/m2  SpO2 98%   LMP 01/19/2015 Gen: calm comfortable, NAD Resp: normal effort, no distress Abd: gravid  ROS, labs, PMH reviewed  Plan[facility] Will need augmentation Anticipate SVD  Makaylen Thieme CNM 10/23/2015, 4:56 AM   OB fellow attestation:  I have seen and examined this patient; I agree with above documentation in the resident's note.   Courtney Vargas is a 20 y.o.  G1P0 here for PROM @ 11am.  PE: BP 150/77 mmHg  Pulse 151  Temp(Src) 98.3 F (36.8 C) (Oral)  Resp 18  Ht  (1.6 m)  Wt 113.399 kg (250 lb)  BMI 44.30 kg/m2  SpO2 98%  LMP 01/19/2015 Gen: calm comfortable, NAD Resp: normal effort, no distress Abd: gravid  ROS, labs, PMH reviewed  Plan: Admit to [facility] to augment with Pitocin Anticipate SVD  Shanina Kepple 10/23/2015, 7:13 AM

## 2015-10-22 NOTE — Anesthesia Preprocedure Evaluation (Addendum)
Anesthesia Evaluation  Patient identified by MRN, date of birth, ID band Patient awake    Reviewed: Allergy & Precautions, H&P , NPO status , Patient's Chart, lab work & pertinent test results  History of Anesthesia Complications Negative for: history of anesthetic complications  Airway Mallampati: II  TM Distance: >3 FB Neck ROM: full    Dental no notable dental hx. (+) Teeth Intact   Pulmonary neg pulmonary ROS, former smoker,    Pulmonary exam normal breath sounds clear to auscultation       Cardiovascular hypertension, negative cardio ROS Normal cardiovascular exam Rhythm:regular Rate:Normal     Neuro/Psych negative neurological ROS  negative psych ROS   GI/Hepatic negative GI ROS, Neg liver ROS,   Endo/Other  negative endocrine ROS  Renal/GU negative Renal ROS  negative genitourinary   Musculoskeletal   Abdominal   Peds  Hematology negative hematology ROS (+)   Anesthesia Other Findings   Reproductive/Obstetrics (+) Pregnancy                           Anesthesia Physical Anesthesia Plan  ASA: III and emergent  Anesthesia Plan: Spinal   Post-op Pain Management:    Induction:   Airway Management Planned: Natural Airway  Additional Equipment:   Intra-op Plan:   Post-operative Plan:   Informed Consent: I have reviewed the patients History and Physical, chart, labs and discussed the procedure including the risks, benefits and alternatives for the proposed anesthesia with the patient or authorized representative who has indicated his/her understanding and acceptance.   Dental advisory given  Plan Discussed with: CRNA, Anesthesiologist and Surgeon  Anesthesia Plan Comments: (Patient for C/Section for arrest of descent.Since she has required multiple boluses for comfort during labor, will d/c epidural and perform SAB for C/Section. M. Daly Whipkey,MD)      Anesthesia Quick  Evaluation

## 2015-10-23 ENCOUNTER — Encounter (HOSPITAL_COMMUNITY): Payer: Self-pay | Admitting: *Deleted

## 2015-10-23 ENCOUNTER — Encounter (HOSPITAL_COMMUNITY)
Admission: AD | Disposition: A | Discharge status: Home / Self Care | Payer: Self-pay | Source: Ambulatory Visit | Attending: Obstetrics and Gynecology

## 2015-10-23 DIAGNOSIS — Z3A39 39 weeks gestation of pregnancy: Secondary | ICD-10-CM

## 2015-10-23 LAB — COMPREHENSIVE METABOLIC PANEL
ALBUMIN: 3.1 g/dL — AB (ref 3.5–5.0)
ALT: 9 U/L — ABNORMAL LOW (ref 14–54)
AST: 16 U/L (ref 15–41)
Alkaline Phosphatase: 102 U/L (ref 38–126)
Anion gap: 6 (ref 5–15)
BILIRUBIN TOTAL: 0.9 mg/dL (ref 0.3–1.2)
BUN: 5 mg/dL — AB (ref 6–20)
CO2: 23 mmol/L (ref 22–32)
Calcium: 8.6 mg/dL — ABNORMAL LOW (ref 8.9–10.3)
Chloride: 103 mmol/L (ref 101–111)
Creatinine, Ser: 0.69 mg/dL (ref 0.44–1.00)
GFR calc Af Amer: >60 mL/min (ref >60)
GFR calc non Af Amer: >60 mL/min (ref >60)
GLUCOSE: 99 mg/dL (ref 65–99)
POTASSIUM: 3.3 mmol/L — AB (ref 3.5–5.1)
SODIUM: 132 mmol/L — AB (ref 135–145)
TOTAL PROTEIN: 6.5 g/dL (ref 6.5–8.1)

## 2015-10-23 LAB — HIV ANTIBODY (ROUTINE TESTING W REFLEX): HIV Screen 4th Generation wRfx: NONREACTIVE

## 2015-10-23 LAB — RPR: RPR: NONREACTIVE

## 2015-10-23 LAB — PROTEIN, URINE, RANDOM: Total Protein, Urine: 186 mg/dL

## 2015-10-23 LAB — CREATININE, URINE, RANDOM: Creatinine, Urine: 128 mg/dL

## 2015-10-23 SURGERY — Surgical Case
Anesthesia: Epidural

## 2015-10-23 MED ORDER — LACTATED RINGERS IV SOLN
INTRAVENOUS | Status: DC
  Administered 2015-10-23: 22:00:00 via INTRAVENOUS

## 2015-10-23 MED ORDER — NALBUPHINE HCL 10 MG/ML IJ SOLN: 5.0000 mg | Freq: Once | INTRAMUSCULAR | Status: DC | PRN

## 2015-10-23 MED ORDER — MORPHINE SULFATE (PF) 0.5 MG/ML IJ SOLN
INTRAMUSCULAR | Status: AC
  Filled 2015-10-23: qty 10

## 2015-10-23 MED ORDER — PRENATAL MULTIVITAMIN CH
1.0000 | ORAL_TABLET | Freq: Every day | ORAL | Status: DC
  Administered 2015-10-24 – 2015-10-25 (×2): 1 via ORAL
  Filled 2015-10-23 (×2): qty 1

## 2015-10-23 MED ORDER — IBUPROFEN 600 MG PO TABS
600.0000 mg | ORAL_TABLET | Freq: Four times a day (QID) | ORAL | Status: DC | PRN
Start: 2015-10-23 — End: 2015-10-25

## 2015-10-23 MED ORDER — MEPERIDINE HCL 25 MG/ML IJ SOLN: 6.2500 mg | INTRAMUSCULAR | Status: DC | PRN

## 2015-10-23 MED ORDER — COCONUT OIL OIL: 1.0000 "application " | TOPICAL_OIL | Status: DC | PRN

## 2015-10-23 MED ORDER — TETANUS-DIPHTH-ACELL PERTUSSIS 5-2.5-18.5 LF-MCG/0.5 IM SUSP: 0.5000 mL | Freq: Once | INTRAMUSCULAR | Status: DC

## 2015-10-23 MED ORDER — ZOLPIDEM TARTRATE 5 MG PO TABS: 5.0000 mg | ORAL_TABLET | Freq: Every evening | ORAL | Status: DC | PRN

## 2015-10-23 MED ORDER — NALOXONE HCL 2 MG/2ML IJ SOSY
1.0000 ug/kg/h | PREFILLED_SYRINGE | INTRAVENOUS | Status: DC | PRN
  Filled 2015-10-23: qty 2

## 2015-10-23 MED ORDER — SENNOSIDES-DOCUSATE SODIUM 8.6-50 MG PO TABS
2.0000 | ORAL_TABLET | ORAL | Status: DC
  Administered 2015-10-24 (×2): 2 via ORAL
  Filled 2015-10-23 (×2): qty 2

## 2015-10-23 MED ORDER — SIMETHICONE 80 MG PO CHEW: 80.0000 mg | CHEWABLE_TABLET | ORAL | Status: DC | PRN

## 2015-10-23 MED ORDER — DIPHENHYDRAMINE HCL 25 MG PO CAPS: 25.0000 mg | ORAL_CAPSULE | Freq: Four times a day (QID) | ORAL | Status: DC | PRN

## 2015-10-23 MED ORDER — SIMETHICONE 80 MG PO CHEW
80.0000 mg | CHEWABLE_TABLET | Freq: Three times a day (TID) | ORAL | Status: DC
  Administered 2015-10-24 – 2015-10-25 (×4): 80 mg via ORAL
  Filled 2015-10-23 (×4): qty 1

## 2015-10-23 MED ORDER — OXYCODONE HCL 5 MG PO TABS
10.0000 mg | ORAL_TABLET | ORAL | Status: DC | PRN
  Administered 2015-10-25: 10 mg via ORAL
  Filled 2015-10-23: qty 2

## 2015-10-23 MED ORDER — PHENYLEPHRINE 8 MG IN D5W 100 ML (0.08MG/ML) PREMIX OPTIME
INJECTION | INTRAVENOUS | Status: DC | PRN
  Administered 2015-10-23: 60 ug/min via INTRAVENOUS
  Administered 2015-10-23: 30 ug/min via INTRAVENOUS

## 2015-10-23 MED ORDER — OXYTOCIN 40 UNITS IN LACTATED RINGERS INFUSION - SIMPLE MED: 2.5000 [IU]/h | INTRAVENOUS | Status: AC

## 2015-10-23 MED ORDER — NALBUPHINE HCL 10 MG/ML IJ SOLN
5.0000 mg | INTRAMUSCULAR | Status: DC | PRN
  Administered 2015-10-24: 5 mg via SUBCUTANEOUS
  Filled 2015-10-23: qty 1

## 2015-10-23 MED ORDER — DIBUCAINE 1 % RE OINT: 1.0000 "application " | TOPICAL_OINTMENT | RECTAL | Status: DC | PRN

## 2015-10-23 MED ORDER — LIDOCAINE-EPINEPHRINE 2 %-1:100000 IJ SOLN
INTRAMUSCULAR | Status: DC | PRN
  Administered 2015-10-23: 5 mL via INTRADERMAL
  Administered 2015-10-23: 5 mL

## 2015-10-23 MED ORDER — IBUPROFEN 600 MG PO TABS
600.0000 mg | ORAL_TABLET | Freq: Four times a day (QID) | ORAL | Status: DC
  Administered 2015-10-24 – 2015-10-25 (×7): 600 mg via ORAL
  Filled 2015-10-23 (×7): qty 1

## 2015-10-23 MED ORDER — FENTANYL CITRATE (PF) 100 MCG/2ML IJ SOLN
INTRAMUSCULAR | Status: AC
  Filled 2015-10-23: qty 2

## 2015-10-23 MED ORDER — OXYTOCIN 10 UNIT/ML IJ SOLN
40.0000 [IU] | INTRAVENOUS | Status: DC | PRN
  Administered 2015-10-23: 40 [IU] via INTRAVENOUS

## 2015-10-23 MED ORDER — ONDANSETRON HCL 4 MG/2ML IJ SOLN
INTRAMUSCULAR | Status: AC
  Filled 2015-10-23: qty 2

## 2015-10-23 MED ORDER — MORPHINE SULFATE (PF) 0.5 MG/ML IJ SOLN
INTRAMUSCULAR | Status: DC | PRN
  Administered 2015-10-23: .2 mg via INTRATHECAL

## 2015-10-23 MED ORDER — OXYCODONE HCL 5 MG PO TABS
5.0000 mg | ORAL_TABLET | ORAL | Status: DC | PRN
  Administered 2015-10-25 (×2): 5 mg via ORAL
  Filled 2015-10-23 (×2): qty 1

## 2015-10-23 MED ORDER — ONDANSETRON HCL 4 MG/2ML IJ SOLN
4.0000 mg | Freq: Three times a day (TID) | INTRAMUSCULAR | Status: DC | PRN
Start: 2015-10-23 — End: 2015-10-25

## 2015-10-23 MED ORDER — KETOROLAC TROMETHAMINE 30 MG/ML IJ SOLN
30.0000 mg | Freq: Four times a day (QID) | INTRAMUSCULAR | Status: AC | PRN
  Administered 2015-10-23: 30 mg via INTRAMUSCULAR

## 2015-10-23 MED ORDER — OXYTOCIN 10 UNIT/ML IJ SOLN
INTRAMUSCULAR | Status: AC
  Filled 2015-10-23: qty 4

## 2015-10-23 MED ORDER — SIMETHICONE 80 MG PO CHEW
80.0000 mg | CHEWABLE_TABLET | ORAL | Status: DC
  Administered 2015-10-24 (×2): 80 mg via ORAL
  Filled 2015-10-23 (×2): qty 1

## 2015-10-23 MED ORDER — ACETAMINOPHEN 500 MG PO TABS
1000.0000 mg | ORAL_TABLET | Freq: Four times a day (QID) | ORAL | Status: AC
  Administered 2015-10-24 (×2): 1000 mg via ORAL
  Filled 2015-10-23 (×2): qty 2

## 2015-10-23 MED ORDER — DIPHENHYDRAMINE HCL 50 MG/ML IJ SOLN: 12.5000 mg | INTRAMUSCULAR | Status: DC | PRN

## 2015-10-23 MED ORDER — NALOXONE HCL 0.4 MG/ML IJ SOLN: 0.4000 mg | INTRAMUSCULAR | Status: DC | PRN

## 2015-10-23 MED ORDER — FENTANYL CITRATE (PF) 100 MCG/2ML IJ SOLN
INTRAMUSCULAR | Status: DC | PRN
  Administered 2015-10-23: 20 ug via INTRATHECAL

## 2015-10-23 MED ORDER — DIPHENHYDRAMINE HCL 25 MG PO CAPS
25.0000 mg | ORAL_CAPSULE | ORAL | Status: DC | PRN
Start: 2015-10-23 — End: 2015-10-25
  Administered 2015-10-24 (×2): 25 mg via ORAL
  Filled 2015-10-23 (×2): qty 1

## 2015-10-23 MED ORDER — PHENYLEPHRINE 8 MG IN D5W 100 ML (0.08MG/ML) PREMIX OPTIME
INJECTION | INTRAVENOUS | Status: AC
  Filled 2015-10-23: qty 100

## 2015-10-23 MED ORDER — CEFAZOLIN SODIUM-DEXTROSE 2-4 GM/100ML-% IV SOLN
2.0000 g | Freq: Once | INTRAVENOUS | Status: AC
  Administered 2015-10-23: 2 g via INTRAVENOUS
  Filled 2015-10-23: qty 100

## 2015-10-23 MED ORDER — SODIUM CHLORIDE 0.9% FLUSH: 3.0000 mL | INTRAVENOUS | Status: DC | PRN

## 2015-10-23 MED ORDER — SODIUM CHLORIDE 0.9 % IR SOLN
Status: DC | PRN
  Administered 2015-10-23: 1

## 2015-10-23 MED ORDER — BUPIVACAINE IN DEXTROSE 0.75-8.25 % IT SOLN
INTRATHECAL | Status: DC | PRN
  Administered 2015-10-23: 10 mg via INTRATHECAL

## 2015-10-23 MED ORDER — KETOROLAC TROMETHAMINE 30 MG/ML IJ SOLN: 30.0000 mg | Freq: Four times a day (QID) | INTRAMUSCULAR | Status: AC | PRN

## 2015-10-23 MED ORDER — MENTHOL 3 MG MT LOZG: 1.0000 | LOZENGE | OROMUCOSAL | Status: DC | PRN

## 2015-10-23 MED ORDER — NALBUPHINE HCL 10 MG/ML IJ SOLN: 5.0000 mg | INTRAMUSCULAR | Status: DC | PRN

## 2015-10-23 MED ORDER — WITCH HAZEL-GLYCERIN EX PADS: 1.0000 "application " | MEDICATED_PAD | CUTANEOUS | Status: DC | PRN

## 2015-10-23 MED ORDER — ACETAMINOPHEN 325 MG PO TABS: 650.0000 mg | ORAL_TABLET | ORAL | Status: DC | PRN

## 2015-10-23 MED ORDER — FENTANYL CITRATE (PF) 100 MCG/2ML IJ SOLN: 25.0000 ug | INTRAMUSCULAR | Status: DC | PRN

## 2015-10-23 SURGICAL SUPPLY — 35 items
BENZOIN TINCTURE PRP APPL 2/3 (GAUZE/BANDAGES/DRESSINGS) ×3 IMPLANT
CLAMP CORD UMBIL (MISCELLANEOUS) IMPLANT
CLOSURE STERI STRIP 1/2 X4 (GAUZE/BANDAGES/DRESSINGS) ×3 IMPLANT
CLOTH BEACON ORANGE TIMEOUT ST (SAFETY) ×3 IMPLANT
DRAIN JACKSON PRT FLT 7MM (DRAIN) IMPLANT
DRSG OPSITE POSTOP 4X10 (GAUZE/BANDAGES/DRESSINGS) ×3 IMPLANT
DURAPREP 26ML APPLICATOR (WOUND CARE) ×3 IMPLANT
ELECT REM PT RETURN 9FT ADLT (ELECTROSURGICAL) ×3
ELECTRODE REM PT RTRN 9FT ADLT (ELECTROSURGICAL) ×1 IMPLANT
EVACUATOR SILICONE 100CC (DRAIN) IMPLANT
EXTRACTOR VACUUM M CUP 4 TUBE (SUCTIONS) IMPLANT
EXTRACTOR VACUUM M CUP 4' TUBE (SUCTIONS)
GLOVE BIO SURGEON STRL SZ7 (GLOVE) ×3 IMPLANT
GLOVE BIOGEL PI IND STRL 7.0 (GLOVE) ×2 IMPLANT
GLOVE BIOGEL PI INDICATOR 7.0 (GLOVE) ×4
GOWN STRL REUS W/TWL LRG LVL3 (GOWN DISPOSABLE) ×6 IMPLANT
KIT ABG SYR 3ML LUER SLIP (SYRINGE) IMPLANT
NEEDLE HYPO 25X5/8 SAFETYGLIDE (NEEDLE) ×3 IMPLANT
NS IRRIG 1000ML POUR BTL (IV SOLUTION) ×3 IMPLANT
PACK C SECTION WH (CUSTOM PROCEDURE TRAY) ×3 IMPLANT
PAD ABD 8X7 1/2 STERILE (GAUZE/BANDAGES/DRESSINGS) ×3 IMPLANT
PAD OB MATERNITY 4.3X12.25 (PERSONAL CARE ITEMS) ×3 IMPLANT
PENCIL SMOKE EVAC W/HOLSTER (ELECTROSURGICAL) ×3 IMPLANT
RTRCTR C-SECT PINK 25CM LRG (MISCELLANEOUS) ×3 IMPLANT
SPONGE GAUZE 4X4 12PLY STER LF (GAUZE/BANDAGES/DRESSINGS) ×6 IMPLANT
SUT MNCRL 0 VIOLET CTX 36 (SUTURE) ×1 IMPLANT
SUT MONOCRYL 0 CTX 36 (SUTURE) ×2
SUT PLAIN 2 0 XLH (SUTURE) ×3 IMPLANT
SUT VIC AB 0 CTX 36 (SUTURE) ×10
SUT VIC AB 0 CTX36XBRD ANBCTRL (SUTURE) ×5 IMPLANT
SUT VIC AB 2-0 SH 27 (SUTURE) ×2
SUT VIC AB 2-0 SH 27XBRD (SUTURE) ×1 IMPLANT
SUT VIC AB 4-0 KS 27 (SUTURE) ×3 IMPLANT
TOWEL OR 17X24 6PK STRL BLUE (TOWEL DISPOSABLE) ×3 IMPLANT
TRAY FOLEY CATH SILVER 14FR (SET/KITS/TRAYS/PACK) ×3 IMPLANT

## 2015-10-23 NOTE — Anesthesia Postprocedure Evaluation (Signed)
Anesthesia Post Note  Patient: Courtney Vargas  Procedure(s) Performed: Procedure(s) (LRB): CESAREAN SECTION (N/A)  Patient location during evaluation: PACU Anesthesia Type: Spinal Level of consciousness: awake and alert and oriented Pain management: pain level controlled Vital Signs Assessment: post-procedure vital signs reviewed and stable Respiratory status: nonlabored ventilation, spontaneous breathing and respiratory function stable Cardiovascular status: blood pressure returned to baseline and stable Postop Assessment: no headache, no backache and spinal receding Anesthetic complications: no     Last Vitals:  Filed Vitals:   10/23/15 1230 10/23/15 1245  BP: 127/62 114/68  Pulse: 97 96  Temp:    Resp: 19 21    Last Pain:  Filed Vitals:   10/23/15 1246  PainSc: 0-No pain   Pain Goal:                 Lacrecia Delval A.

## 2015-10-23 NOTE — Transfer of Care (Signed)
Immediate Anesthesia Transfer of Care Note  Patient: Courtney Vargas  Procedure(s) Performed: Procedure(s): CESAREAN SECTION (N/A)  Patient Location: PACU  Anesthesia Type:Spinal  Level of Consciousness: awake  Airway & Oxygen Therapy: Patient Spontanous Breathing  Post-op Assessment: Report given to RN  Post vital signs: Reviewed and stable  Last Vitals:  Filed Vitals:   10/23/15 1030 10/23/15 1035  BP: 98/58 105/64  Pulse: 127 122  Temp:    Resp:      Last Pain:  Filed Vitals:   10/23/15 1130  PainSc: 0-No pain         Complications: No apparent anesthesia complications

## 2015-10-23 NOTE — Progress Notes (Signed)
Courtney Vargas is a 20 y.o. G1P0 at 6169w4d in active labor  Subjective: Pt having sever pain in left buttocks cheek that is not respondin g to epidural rebolus.  Anesthesia aware.  Objective: BP 86/69 mmHg  Pulse 116  Temp(Src) 98.4 F (36.9 C) (Oral)  Resp 20  Ht 5\' 3"  (1.6 m)  Wt 250 lb (113.399 kg)  BMI 44.30 kg/m2  SpO2 98%  LMP 01/19/2015 I/O last 3 completed shifts: In: -  Out: 750 [Urine:750]    FHT:  FHR: 150 bpm, variability: moderate,  accelerations:  Present,  decelerations:  Absent UC:   regular, every 1-3 minutes SVE:   Pt's exam more like 5-6 cm.. Cervix is swollen from prior effacement.  Head asynclitic with molding  Labs: Lab Results  Component Value Date   WBC 12.4* 10/22/2015   HGB 11.3* 10/22/2015   HCT 30.6* 10/22/2015   MCV 82.9 10/22/2015   PLT 259 10/22/2015    Assessment / Plan: Arrest in active phase of labor  Category 1 tracing.  Pt request c/s.  I feel this is reasonable given swelling of cervix nad lack of pregression with >4.5 hours of adequate contractions with IUPC.  The risks of cesarean section discussed with the patient included but were not limited to: bleeding which may require transfusion or reoperation; infection which may require antibiotics; injury to bowel, bladder, ureters or other surrounding organs; injury to the fetus; need for additional procedures including hysterectomy in the event of a life-threatening hemorrhage; placental abnormalities wth subsequent pregnancies, incisional problems, thromboembolic phenomenon and other postoperative/anesthesia complications. The patient concurred with the proposed plan, giving informed written consent for the procedure.     Tamra Koos H. 10/23/2015, 9:26 AM

## 2015-10-23 NOTE — Op Note (Signed)
Cesarean Section Operative Report  Courtney Vargas  10/22/2015 - 10/23/2015  Indications: arrest of dilation  Pre-operative Diagnosis: primary cesarean section for failure to dilate.   Post-operative Diagnosis: Same   Surgeon: Surgeon(s) and Role:    * Lesly DukesKelly H Leggett, MD - Primary    * Kathrynn RunningNoah Bedford Kori Goins, MD - Fellow   Attending Attestation: I was present and scrubbed for the entire procedure.   Assistants: none  Anesthesia: epidural, spinal    Estimated Blood Loss: 800 ml  Total IV Fluids: 1900 ml LR  Urine Output:: 200 ml pink tinged urine clearing as case progressed  Specimens: none  Findings: Viable female infant in cephalic presentation; Apgars (not calculated at time of filing this note); weight 3530 g; arterial cord pH not obtained; clear amniotic fluid; intact placenta with three vessel cord; normal uterus, fallopian tubes and ovaries bilaterally.  Baby condition / location:  Couplet care / Skin to Skin   Complications: no complications  Indications: Courtney Vargas is a 20 y.o. G1P1001 with an IUP 2246w4d presenting with prom. Arrest of dilation. Please see progress note for further discussion of indications..  The risks, benefits, complications, treatment options, and exected outcomes were discussed with the patient . The patient dwith the proposed plan, giving informed consent. identified as Courtney Vargas and the procedure verified as C-Section Delivery.  Procedure Details:  The patient was taken back to the operative suite where epidural anesthesia was found to be inadequate; spinal was placed.  A time out was held and the above information confirmed.   After induction of anesthesia, the patient was draped and prepped in the usual sterile manner and placed in a dorsal supine position with a leftward tilt. A Pfannenstiel incision was made and carried down through the subcutaneous tissue to the fascia. Fascial incision was made and sharply extended transversely. The fascia  was separated from the underlying rectus tissue superiorly and inferiorly. The peritoneum was identified and bluntly entered and extended longitudinally. Alexis retractor was placed. Bladder flap created; bladder blade placed. A low transverse uterine incision was made and extended bluntly. Delivered from cephalic presentation was a viable infant with Apgars and weight as above.  After waiting 60 seconds for delayed cord cutting, the umbilical cord was clamped and cut cord blood was obtained for evaluation. Cord ph was not sent. The placenta was removed Intact and appeared normal. The uterine outline, tubes and ovaries appeared normal. Left lateral hysterotomy extension was noted with bleeding at that margin. The uterine incision was closed with running locked sutures of 0 monocryl with an imbricating layer of the same.   Hemostasis was observed. The peritoneum was closed with 0 vicryl. The rectus muscles were examined and hemostasis observed. The fascia was then reapproximated with running sutures of 0Vicryl.  The subcuticular closure was performed using 2-0plain gut. The skin was closed with 4-0Vicryl.   Instrument, sponge, and needle counts were correct prior the abdominal closure and were correct at the conclusion of the case.     Disposition: PACU - hemodynamically stable.   Maternal Condition: stable       Signed: Cherrie Gauzeoah B WoukMD 10/23/2015 12:04 PM

## 2015-10-23 NOTE — Addendum Note (Signed)
Addendum  created 10/23/15 1713 by Algis GreenhouseLinda A Sacha Radloff, CRNA   Modules edited: Clinical Notes   Clinical Notes:  File: [id number]

## 2015-10-23 NOTE — Progress Notes (Cosign Needed)
Labor Progress Note Ria BushSada Schoolfield is a 20 y.o. G1P0 at 2050w4d presented for SROM S:  Patient doing well. No concerns.   O:  BP 133/97 mmHg  Pulse 99  Temp(Src) 98.3 F (36.8 C) (Oral)  Resp 16  Ht 5\' 3"  (1.6 m)  Wt 113.399 kg (250 lb)  BMI 44.30 kg/m2  SpO2 98%  LMP 01/19/2015   CVE: Dilation: 5 Effacement (%): 90 Cervical Position: Middle Station: -1 Presentation: Vertex Exam by:: E. Siska, RN   A&P: 20 y.o. G1P0 7050w4d SROM at 1100 on 7/3. Pitocin started at 2300.  #Labor: Currently on Pitocin. Placed IUPC for better monitoring of contractions  #Pain: Epidural #FWB: HR 140, min-mod variability, accels noted.  #GBS negative   Palma HolterKanishka G Gunadasa, MD 2:58 AM

## 2015-10-23 NOTE — Anesthesia Postprocedure Evaluation (Signed)
Anesthesia Post Note  Patient: Courtney BushSada Cerone  Procedure(s) Performed: Procedure(s) (LRB): CESAREAN SECTION (N/A)  Patient location during evaluation: Mother Baby Anesthesia Type: Spinal Level of consciousness: awake Pain management: satisfactory to patient Vital Signs Assessment: post-procedure vital signs reviewed and stable Respiratory status: spontaneous breathing Cardiovascular status: stable Anesthetic complications: no     Last Vitals:  Filed Vitals:   10/23/15 1545 10/23/15 1645  BP: 126/68 130/72  Pulse: 84 88  Temp: 36.7 C 36.8 C  Resp: 20 18    Last Pain:  Filed Vitals:   10/23/15 1656  PainSc: 0-No pain   Pain Goal:                 KeyCorpBURGER,Tanyiah Laurich

## 2015-10-23 NOTE — Anesthesia Pain Management Evaluation Note (Signed)
  CRNA Pain Management Visit Note  Patient: Courtney Vargas, 20 y.o., female  "Hello I am a member of the anesthesia team at [facility]. We have an anesthesia team available at all times to provide care throughout the hospital, including epidural management and anesthesia for C-section. I don't know your plan for the delivery whether it a natural birth, water birth, IV sedation, nitrous supplementation, doula or epidural, but we want to meet your pain goals."   1.Was your pain managed to your expectations on prior hospitalizations?   No prior hospitalizations  2.What is your expectation for pain management during this hospitalization?     Epidural  3.How can we help you reach that goal? Epidural, Feeling pressure but refusing epidural bolus  Record the patient's initial score and the patient's pain goal.   Pain: 8  Pain Goal: 7 [facility] wants you to be able to say your pain was always managed very well.  Courtney Vargas,Courtney Vargas 10/23/2015

## 2015-10-24 LAB — CBC
HEMATOCRIT: 27.1 % — AB (ref 36.0–46.0)
HEMOGLOBIN: 9.9 g/dL — AB (ref 12.0–15.0)
MCH: 30.5 pg (ref 26.0–34.0)
MCHC: 36.5 g/dL — ABNORMAL HIGH (ref 30.0–36.0)
MCV: 83.4 fL (ref 78.0–100.0)
PLATELETS: 226 10*3/uL (ref 150–400)
RBC: 3.25 MIL/uL — AB (ref 3.87–5.11)
RDW: 12.8 % (ref 11.5–15.5)
WBC: 17.5 10*3/uL — AB (ref 4.0–10.5)
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
## 2015-10-24 NOTE — Progress Notes (Signed)
Care assumed from Corey HaroldA. Goss RN change in assignment late

## 2015-10-24 NOTE — Anesthesia Postprocedure Evaluation (Signed)
Anesthesia Post Note  Patient: Ria BushSada Silveria  Procedure(s) Performed: Procedure(s) (LRB): CESAREAN SECTION (N/A)  Patient location during evaluation: Mother Baby Anesthesia Type: Spinal Level of consciousness: oriented and awake and alert Pain management: pain level controlled Vital Signs Assessment: post-procedure vital signs reviewed and stable Respiratory status: spontaneous breathing and respiratory function stable Cardiovascular status: blood pressure returned to baseline and stable Postop Assessment: no headache and no backache Anesthetic complications: no     Last Vitals:  Filed Vitals:   10/24/15 0100 10/24/15 0500  BP:  134/66  Pulse:  81  Temp: 36.4 C 37 C  Resp: 18 16    Last Pain:  Filed Vitals:   10/24/15 0618  PainSc: 6    Pain Goal:                 Junious SilkGILBERT,Christabel Camire

## 2015-10-24 NOTE — Progress Notes (Signed)
Assumed care of mom and baby.  Mom holding baby skin to skin .

## 2015-10-24 NOTE — Progress Notes (Signed)
POSTPARTUM PROGRESS NOTE  Post Partum Day 1 Subjective:  Courtney Vargas is a 20 y.o. G1P1001 2334w4d s/p pltcs.  No acute events overnight.  Pt denies problems with ambulating, voiding or po intake.  She denies nausea or vomiting.  Pain is well controlled.  She has had flatus. She has not had bowel movement.  Lochia Small.   Objective: Blood pressure 134/66, pulse 81, temperature 98.6 F (37 C), temperature source Oral, resp. rate 16, height 5\' 3"  (1.6 m), weight 250 lb (113.399 kg), last menstrual period 01/19/2015, SpO2 97 %, unknown if currently breastfeeding.  Physical Exam:  General: alert, cooperative and no distress Lochia:normal flow Chest: CTAB Heart: RRR no m/r/g Abdomen: +BS, soft, nontender,  Uterine Fundus: firm, bandage c/d/i DVT Evaluation: No calf swelling or tenderness Extremities: trace edema   Recent Labs  10/22/15 1855 10/24/15 0620  HGB 11.3* 9.9*  HCT 30.6* 27.1*    Assessment/Plan:  ASSESSMENT: Courtney BushSada Vargas is a 20 y.o. G1P1001 7434w4d s/p pltcs, doing well. h 9.9 this morning.   Plan for discharge tomorrow   LOS: 2 days   Silvano Bilisoah B Deklyn Trachtenberg 10/24/2015, 7:33 AM

## 2015-10-24 NOTE — Addendum Note (Signed)
Addendum  created 10/24/15 1000 by Junious SilkMelinda Florette Thai, CRNA   Modules edited: Clinical Notes   Clinical Notes:  File: [id number]

## 2015-10-25 LAB — RUBELLA SCREEN: Rubella: 3.61 index (ref >0.99)

## 2015-10-25 MED ORDER — IBUPROFEN 600 MG PO TABS: 600.0000 mg | ORAL_TABLET | Freq: Four times a day (QID) | ORAL | Status: DC | PRN

## 2015-10-25 MED ORDER — OXYCODONE HCL 5 MG PO TABS: 5.0000 mg | ORAL_TABLET | ORAL | Status: DC | PRN

## 2015-10-25 NOTE — Discharge Summary (Signed)
OB Discharge Summary     Patient Name: Courtney BushSada Filsaime DOB: 1996/03/13 MRN: [id number]  Date of admission: 10/22/2015 Delivering MD: Shonna ChockWOUK, NOAH BEDFORD   Date of discharge: 10/25/2015  Admitting diagnosis: 39.6w ctx Intrauterine pregnancy: 1466w4d     Secondary diagnosis:  Active Problems:   PROM (premature rupture of membranes)  Additional problems: none     Discharge diagnosis: Term Pregnancy Delivered                                                                                                Post partum procedures:none  Augmentation: Pitocin  Complications: None  Hospital course:  Onset of Labor With Unplanned C/S  20 y.o. yo G1P1001 at 8866w4d was admitted in Latent Labor on 10/22/2015. Patient had a labor course significant for PROM @ 11am on 7/3 followed by initial cx change to 3cm. Pitocin was started to augment contractions, and IUPC was placed as well. Membrane Rupture Time/Date: 11:00 AM ,10/22/2015   The patient went for cesarean section due to Arrest of Dilation when pt reached 5-6 cm with a swollen cx, and via LTCS delivered a Viable infant,10/23/2015  Details of operation can be found in separate operative note. Patient had an uncomplicated postpartum course.  She is ambulating,tolerating a regular diet, passing flatus, and urinating well.  Patient is discharged home in stable condition 10/25/2015.  Physical exam  Filed Vitals:   10/24/15 0500 10/24/15 0900 10/24/15 1825 10/25/15 0715  BP: 134/66 129/88 119/76 123/76  Pulse: 81 108 104 87  Temp: 98.6 F (37 C) 98.3 F (36.8 C) 97.9 F (36.6 C) 98 F (36.7 C)  TempSrc:  Oral Oral Oral  Resp: 16 18 18 18   Height:      Weight:      SpO2:  96% 100%    General: alert and cooperative Lochia: appropriate Uterine Fundus: firm Incision: Healing well with no significant drainage DVT Evaluation: No evidence of DVT seen on physical exam. Labs: Lab Results  Component Value Date   WBC 17.5* 10/24/2015   HGB 9.9* 10/24/2015    HCT 27.1* 10/24/2015   MCV 83.4 10/24/2015   PLT 226 10/24/2015   CMP Latest Ref Rng 10/23/2015  Glucose 65 - 99 mg/dL 99  BUN 6 - 20 mg/dL 5(L)  Creatinine 4.090.44 - 1.00 mg/dL 8.110.69  Sodium 914135 - 782145 mmol/L 132(L)  Potassium 3.5 - 5.1 mmol/L 3.3(L)  Chloride 101 - 111 mmol/L 103  CO2 22 - 32 mmol/L 23  Calcium 8.9 - 10.3 mg/dL 9.5(A8.6(L)  Total Protein 6.5 - 8.1 g/dL 6.5  Total Bilirubin 0.3 - 1.2 mg/dL 0.9  Alkaline Phos 38 - 126 U/L 102  AST 15 - 41 U/L 16  ALT 14 - 54 U/L 9(L)    Discharge instruction: per After Visit Summary and "Baby and Me Booklet".  After visit meds:    Medication List    STOP taking these medications        calcium carbonate 500 MG chewable tablet  Commonly known as:  TUMS - dosed in mg elemental calcium  TAKE these medications        ibuprofen 600 MG tablet  Commonly known as:  ADVIL,MOTRIN  Take 1 tablet (600 mg total) by mouth every 6 (six) hours as needed for mild pain.     oxyCODONE 5 MG immediate release tablet  Commonly known as:  Oxy IR/ROXICODONE  Take 1 tablet (5 mg total) by mouth every 4 (four) hours as needed (pain scale 4-7).     prenatal multivitamin Tabs tablet  Take 1 tablet by mouth daily at 12 noon.        Diet: routine diet  Activity: Advance as tolerated. Pelvic rest for 6 weeks.   [facility] Follow up Appt:No future appointments. Follow up Visit:No Follow-up on file.  Postpartum contraception: Undecided  Newborn Data: Live born female  Birth Weight: 7 lb 12.5 oz (3530 g) APGAR: ,   Baby Feeding: Bottle Disposition:home with mother   10/25/2015 Cam HaiSHAW, Wendi Lastra, CNM  9:23 AM

## 2015-10-25 NOTE — Discharge Instructions (Signed)

## 2015-11-01 ENCOUNTER — Encounter (HOSPITAL_COMMUNITY): Payer: Self-pay | Admitting: *Deleted

## 2015-11-22 ENCOUNTER — Encounter (HOSPITAL_BASED_OUTPATIENT_CLINIC_OR_DEPARTMENT_OTHER): Payer: Self-pay | Admitting: *Deleted

## 2015-11-22 ENCOUNTER — Emergency Department (HOSPITAL_BASED_OUTPATIENT_CLINIC_OR_DEPARTMENT_OTHER)
Admission: EM | Admit: 2015-11-22 | Discharge: 2015-11-22 | Disposition: A | Discharge status: Home / Self Care | Payer: Medicaid Other | Attending: Emergency Medicine | Admitting: Emergency Medicine

## 2015-11-22 ENCOUNTER — Emergency Department (HOSPITAL_BASED_OUTPATIENT_CLINIC_OR_DEPARTMENT_OTHER): Payer: Medicaid Other

## 2015-11-22 DIAGNOSIS — N760 Acute vaginitis: Secondary | ICD-10-CM | POA: Insufficient documentation

## 2015-11-22 DIAGNOSIS — R102 Pelvic and perineal pain: Secondary | ICD-10-CM

## 2015-11-22 DIAGNOSIS — I10 Essential (primary) hypertension: Secondary | ICD-10-CM | POA: Diagnosis not present

## 2015-11-22 DIAGNOSIS — R1031 Right lower quadrant pain: Secondary | ICD-10-CM | POA: Insufficient documentation

## 2015-11-22 DIAGNOSIS — B9689 Other specified bacterial agents as the cause of diseases classified elsewhere: Secondary | ICD-10-CM

## 2015-11-22 DIAGNOSIS — R109 Unspecified abdominal pain: Secondary | ICD-10-CM

## 2015-11-22 DIAGNOSIS — F17210 Nicotine dependence, cigarettes, uncomplicated: Secondary | ICD-10-CM | POA: Diagnosis not present

## 2015-11-22 LAB — URINALYSIS, ROUTINE W REFLEX MICROSCOPIC
Bilirubin Urine: NEGATIVE
Glucose, UA: NEGATIVE mg/dL
Ketones, ur: 15 mg/dL — AB
NITRITE: NEGATIVE
PROTEIN: NEGATIVE mg/dL
Specific Gravity, Urine: 1.03 (ref 1.005–1.030)
pH: 6 (ref 5.0–8.0)

## 2015-11-22 LAB — WET PREP, GENITAL
Sperm: NONE SEEN
Trich, Wet Prep: NONE SEEN
Yeast Wet Prep HPF POC: NONE SEEN

## 2015-11-22 LAB — URINE MICROSCOPIC-ADD ON

## 2015-11-22 LAB — PREGNANCY, URINE: PREG TEST UR: NEGATIVE

## 2015-11-22 MED ORDER — CEFTRIAXONE SODIUM 250 MG IJ SOLR
250.0000 mg | Freq: Once | INTRAMUSCULAR | Status: AC
  Administered 2015-11-22: 250 mg via INTRAMUSCULAR
  Filled 2015-11-22: qty 250

## 2015-11-22 MED ORDER — METRONIDAZOLE 500 MG PO TABS: 500.0000 mg | ORAL_TABLET | Freq: Two times a day (BID) | ORAL | 0 refills | Status: DC

## 2015-11-22 MED ORDER — AZITHROMYCIN 1 G PO PACK
1.0000 g | PACK | Freq: Once | ORAL | Status: AC
  Administered 2015-11-22: 1 g via ORAL
  Filled 2015-11-22: qty 1

## 2015-11-22 MED ORDER — ACETAMINOPHEN 325 MG PO TABS
650.0000 mg | ORAL_TABLET | Freq: Once | ORAL | Status: AC
  Administered 2015-11-22: 650 mg via ORAL
  Filled 2015-11-22: qty 2

## 2015-11-22 MED ORDER — IBUPROFEN 400 MG PO TABS
600.0000 mg | ORAL_TABLET | Freq: Once | ORAL | Status: DC
  Filled 2015-11-22: qty 1

## 2015-11-22 NOTE — ED Notes (Signed)
Pt asking how much longer it would be until she is discharged. Pt is awaiting results of [place]. Pt made aware results of [place] usually take 30-45 mins  After testing is complete. Pt verbalized understanding.

## 2015-11-22 NOTE — Discharge Instructions (Signed)
Take over-the-counter ibuprofen as needed for pain. Be sure to eat when you take this medication as it can be hard to your stomach. Take the Flagyl as prescribed. Do not drink alcohol while on this medication. Follow-up with your primary care provider within 2 days to be reevaluated and if your symptoms do not improve.  Return to emergency if you experience worsening pain, pain with urination, nausea, vomiting, fever, diarrhea, abnormal vaginal discharge, or any other concerning symptoms.

## 2015-11-22 NOTE — ED Triage Notes (Signed)
Pt c/o right flank pain x 2 days recent c section x 1 month ago

## 2015-11-22 NOTE — ED Provider Notes (Signed)
[facility] Provider Note   CSN: [id number] Arrival date & time: 11/22/15  1729  First Provider Contact:  First MD Initiated Contact with Patient 11/22/15 1820    By signing my name below, I, Octavia Heir, attest that this documentation has been prepared under the direction and in the presence of [employer], PA-C.  Electronically Signed: Octavia Heir, ED Scribe. 11/22/15. 6:26 PM.    History   Chief Complaint Chief Complaint  Patient presents with  . Flank Pain    The history is provided by the patient. No language interpreter was used.   HPI Comments: Courtney Vargas is a 20 y.o. female who presents to the Emergency Department complaining of sudden onset, unchanged, 5/10, non-radiating cramping and aching RLQ pain onset last night. She notes associated loss of appetite. Pt says her pain worsens with any kind of movement and coughing. She notes she took some ibuprofen to alleviate her pain with mild relief. She reports giving birth via cesarean section on 7/4. She has not been sexually active since her birth. She denies nausea, vomiting, diarrhea, blood in stool, dysuria, hematuria, or abnormal vaginal discharge.  Past Medical History:  Diagnosis Date  . GERD (gastroesophageal reflux disease)   . Hypertension    states this has resolved  . Prediabetes    states this has resolved    Patient Active Problem List   Diagnosis Date Noted  . PROM (premature rupture of membranes) 10/22/2015    Past Surgical History:  Procedure Laterality Date  . CESAREAN SECTION N/A 10/23/2015   Procedure: CESAREAN SECTION;  Surgeon: Lesly Dukes, MD;  Location: [facility] BIRTHING SUIT[facility];  Service: Obstetrics;  Laterality: N/A;  . NO PAST SURGERIES      OB History    Gravida Para Term Preterm AB Living   SAB TAB Ectopic Multiple Live Births         0 1       Home Medications    Prior to Admission medications   Medication Sig Start Date End Date Taking?  Authorizing Provider  metroNIDAZOLE (FLAGYL) 500 MG tablet Take 1 tablet (500 mg total) by mouth 2 (two) times daily. 11/22/15   Jerre Simon, PA  Prenatal Vit-Fe Fumarate-FA (PRENATAL MULTIVITAMIN) TABS tablet Take 1 tablet by mouth daily at 12 noon.    Historical Provider, MD    Family History Family History  Problem Relation Age of Onset  . Hypertension Mother   . Diabetes Maternal Grandmother   . Hypertension Maternal Grandmother   . Diabetes Maternal Grandfather   . Hypertension Maternal Grandfather   . Diabetes Paternal Grandmother   . Hypertension Paternal Grandmother   . Diabetes Paternal Grandfather   . Hypertension Paternal Grandfather     Social History Social History  Substance Use Topics  . Smoking status: Current Every Day Smoker    Packs/day: 0.50    Types: Cigarettes  . Smokeless tobacco: Never Used  . Alcohol use No     Allergies   Review of patient's allergies indicates no known allergies.   Review of Systems Review of Systems  Constitutional: Negative for chills and fever.  HENT: Negative for sore throat and trouble swallowing.   Gastrointestinal: Positive for abdominal pain. Negative for diarrhea, nausea and vomiting.  Genitourinary: Positive for frequency. Negative for dysuria, flank pain, hematuria and vaginal discharge.  Musculoskeletal: Negative for back pain, neck pain and neck stiffness.  Skin: Negative for rash.  Neurological: Negative for dizziness, syncope and weakness.     Physical Exam Updated Vital Signs BP 137/81   Pulse 94   Temp 98.3 F (36.8 C)   Resp 16   Ht 5\' 2"  (1.575 m)   Wt 217 lb (98.4 kg)   SpO2 100%   Breastfeeding? No   BMI 39.69 kg/m   Physical Exam  Constitutional: She appears well-developed and well-nourished. No distress.  HENT:  Head: Normocephalic and atraumatic.  Eyes: Conjunctivae are normal.  Neck: Normal range of motion. Neck supple.  Cardiovascular: Normal rate, regular rhythm and normal heart  sounds.  Exam reveals no gallop and no friction rub.   No murmur heard. Pulses:      Dorsalis pedis pulses are 2+ on the right side, and 2+ on the left side.  Pulmonary/Chest: Effort normal and breath sounds normal. No respiratory distress. She has no wheezes. She has no rales.  Abdominal: There is tenderness. There is no CVA tenderness.  Tenderness to palpation in RLQ and suprapubic region   Genitourinary:  Genitourinary Comments: Exam performed by Jerre Simon,  exam chaperoned Date: 11/22/2015 Pelvic exam: normal external genitalia without evidence of trauma. VULVA: normal appearing vulva with no masses, tenderness or lesion. VAGINA: normal appearing vagina with normal color and discharge, no lesions. CERVIX: normal appearing cervix without lesions, no cervical motion tenderness, cervical os closed with out purulent discharge; vaginal discharge - copious, bloody, malodorous, milky and mucoid, Wet prep and DNA probe for chlamydia and GC obtained.   ADNEXA: normal adnexa in size, nontender on the left and tenderness noted on the right, no masses UTERUS: uterus is normal size, shape, consistency and nontender.   Musculoskeletal: Normal range of motion. She exhibits no edema.  Neurological: She is alert. Coordination normal.  Skin: Skin is warm and dry. No rash noted. She is not diaphoretic.  Psychiatric: She has a normal mood and affect. Her behavior is normal.  Nursing note and vitals reviewed.    ED Treatments / Results  DIAGNOSTIC STUDIES: Oxygen Saturation is 100% on RA, normal by my interpretation.  COORDINATION OF CARE:  6:25 PM Discussed treatment plan which includes pelvic exam and pain medication with pt at bedside and pt agreed to plan.  Labs (all labs ordered are listed, but only abnormal results are displayed) Labs Reviewed  WET PREP, GENITAL - Abnormal; Notable for the following:       Result Value   Clue Cells Wet Prep HPF POC PRESENT (*)    WBC, Wet Prep HPF  POC MANY (*)    All other components within normal limits  URINALYSIS, ROUTINE W REFLEX MICROSCOPIC (NOT AT [facility]) - Abnormal; Notable for the following:    Hgb urine dipstick SMALL (*)    Ketones, ur 15 (*)    Leukocytes, UA MODERATE (*)    All other components within normal limits  URINE MICROSCOPIC-ADD ON - Abnormal; Notable for the following:    Squamous Epithelial / LPF 0-5 (*)    Bacteria, UA FEW (*)    All other components within normal limits  URINE CULTURE  PREGNANCY, URINE  RPR  HIV ANTIBODY (ROUTINE TESTING)  GC/CHLAMYDIA PROBE AMP () NOT AT [facility]    EKG  EKG Interpretation None       Radiology US Transvaginal Non-ob  Result Date: 11/22/2015 CLINICAL DATA:  20 year old female with acute onset right adnexal pain 16 hours ago. Postpartum 1 month ago. Initial encounter. EXAM: TRANSABDOMINAL AND TRANSVAGINAL ULTRASOUND OF PELVIS  DOPPLER ULTRASOUND OF OVARIES TECHNIQUE: Both transabdominal and transvaginal ultrasound examinations of the pelvis were performed. Transabdominal technique was performed for global imaging of the pelvis including uterus, ovaries, adnexal regions, and pelvic cul-de-sac. It was necessary to proceed with endovaginal exam following the transabdominal exam to visualize the ovaries. Color and duplex Doppler ultrasound was utilized to evaluate blood flow to the ovaries. COMPARISON:  Ob ultrasound 09/14/2015. FINDINGS: Uterus Measurements: 12.2 x 5.5 x 6.3 cm. C-section scar visible (image 24). No fibroids or other mass visualized. Endometrium Thickness: 1-2 mm. Small volume of fluid in the lower endometrial canal and cervix (also on image 24). No focal abnormality visualized. Right ovary Measurements: 3.8 x 2.8 x 4.6 cm. Multiple small follicles. Normal appearance/no adnexal mass. Left ovary Measurements: 4.6 x 2.5 x 4.5 cm. Normal appearance/no adnexal mass. Pulsed Doppler evaluation of both ovaries demonstrates normal low-resistance arterial and venous  waveforms. And if anything, the right ovary waveforms were easier to obtain. Other findings No abnormal free fluid. IMPRESSION: 1. No evidence of ovarian torsion. No abnormality of the right ovary identified. 2. Uterine C-section scar visible. Small volume of fluid in the lower endometrial canal and cervix presumed to be physiologic. Electronically Signed   By: Odessa Fleming M.D.   On: 11/22/2015 20:12   US Pelvis Complete  Result Date: 11/22/2015 CLINICAL DATA:  20 year old female with acute onset right adnexal pain 16 hours ago. Postpartum 1 month ago. Initial encounter. EXAM: TRANSABDOMINAL AND TRANSVAGINAL ULTRASOUND OF PELVIS DOPPLER ULTRASOUND OF OVARIES TECHNIQUE: Both transabdominal and transvaginal ultrasound examinations of the pelvis were performed. Transabdominal technique was performed for global imaging of the pelvis including uterus, ovaries, adnexal regions, and pelvic cul-de-sac. It was necessary to proceed with endovaginal exam following the transabdominal exam to visualize the ovaries. Color and duplex Doppler ultrasound was utilized to evaluate blood flow to the ovaries. COMPARISON:  Ob ultrasound 09/14/2015. FINDINGS: Uterus Measurements: 12.2 x 5.5 x 6.3 cm. C-section scar visible (image 24). No fibroids or other mass visualized. Endometrium Thickness: 1-2 mm. Small volume of fluid in the lower endometrial canal and cervix (also on image 24). No focal abnormality visualized. Right ovary Measurements: 3.8 x 2.8 x 4.6 cm. Multiple small follicles. Normal appearance/no adnexal mass. Left ovary Measurements: 4.6 x 2.5 x 4.5 cm. Normal appearance/no adnexal mass. Pulsed Doppler evaluation of both ovaries demonstrates normal low-resistance arterial and venous waveforms. And if anything, the right ovary waveforms were easier to obtain. Other findings No abnormal free fluid. IMPRESSION: 1. No evidence of ovarian torsion. No abnormality of the right ovary identified. 2. Uterine C-section scar visible.  Small volume of fluid in the lower endometrial canal and cervix presumed to be physiologic. Electronically Signed   By: Odessa Fleming M.D.   On: 11/22/2015 20:12   [place] Art/ven Flow Abd Pelv Doppler  Result Date: 11/22/2015 CLINICAL DATA:  20 year old female with acute onset right adnexal pain 16 hours ago. Postpartum 1 month ago. Initial encounter. EXAM: TRANSABDOMINAL AND TRANSVAGINAL ULTRASOUND OF PELVIS DOPPLER ULTRASOUND OF OVARIES TECHNIQUE: Both transabdominal and transvaginal ultrasound examinations of the pelvis were performed. Transabdominal technique was performed for global imaging of the pelvis including uterus, ovaries, adnexal regions, and pelvic cul-de-sac. It was necessary to proceed with endovaginal exam following the transabdominal exam to visualize the ovaries. Color and duplex Doppler ultrasound was utilized to evaluate blood flow to the ovaries. COMPARISON:  Ob ultrasound 09/14/2015. FINDINGS: Uterus Measurements: 12.2 x 5.5 x 6.3 cm. C-section scar visible (image 24). No fibroids  or other mass visualized. Endometrium Thickness: 1-2 mm. Small volume of fluid in the lower endometrial canal and cervix (also on image 24). No focal abnormality visualized. Right ovary Measurements: 3.8 x 2.8 x 4.6 cm. Multiple small follicles. Normal appearance/no adnexal mass. Left ovary Measurements: 4.6 x 2.5 x 4.5 cm. Normal appearance/no adnexal mass. Pulsed Doppler evaluation of both ovaries demonstrates normal low-resistance arterial and venous waveforms. And if anything, the right ovary waveforms were easier to obtain. Other findings No abnormal free fluid. IMPRESSION: 1. No evidence of ovarian torsion. No abnormality of the right ovary identified. 2. Uterine C-section scar visible. Small volume of fluid in the lower endometrial canal and cervix presumed to be physiologic. Electronically Signed   By: Odessa Fleming M.D.   On: 11/22/2015 20:12    Procedures Procedures (including critical care time)  Medications  Ordered in ED Medications  acetaminophen (TYLENOL) tablet 650 mg (650 mg Oral Given 11/22/15 1901)  cefTRIAXone (ROCEPHIN) injection 250 mg (250 mg Intramuscular Given 11/22/15 2041)  azithromycin (ZITHROMAX) powder 1 g (1 g Oral Given 11/22/15 2041)     Initial Impression / Assessment and Plan / ED Course  I have reviewed the triage vital signs and the nursing notes.  Pertinent labs & imaging results that were available during my care of the patient were reviewed by me and considered in my medical decision making (see chart for details).  Clinical Course   Patient with right lower quadrant and suprapubic abdominal pain. Patient is nontoxic, nonseptic appearing, in no apparent distress.  Patient's pain adequately managed in emergency department. Labs, imaging and vitals reviewed.  Patient does not meet the SIRS or Sepsis criteria.  On repeat exam patient does not have a surgical abdomin and there are no peritoneal signs.  No indication of appendicitis, bowel obstruction, bowel perforation, cholecystitis, diverticulitis, PID or ectopic pregnancy.    Patient treated in the ED for STI with Rocephin and azithomycin. Patient advised to inform and treat all sexual partners.  Pt advised on safe sex practices and understands that they have GC/Chlamydia cultures pending and will result in 2-3 days. Pt with right adnexal tenderness on pelvic exam no CMT. [place] of pelvis revealed a small volume of fluid in the lower endometrial canal and cervix no abnormalities noted of the ovaries.  HIV and RPR sent. Clue cells on wet prep. Will discharge with Flagyl. Instructed patient to not drink alcoholic while taking this medication.  Pt encouraged to follow up at local health department for future STI checks. No concern for PID. Discussed return precautions. Pt appears safe for discharge.   Case discussed with Dr. Clydene Pugh who agrees with the above plan.  I personally performed the services described in this documentation,  which was scribed in my presence. The recorded information has been reviewed and is accurate.    Final Clinical Impressions(s) / ED Diagnoses   Final diagnoses:  Right adnexal tenderness  Abdominal pain, unspecified abdominal location  Bacterial vaginosis    New Prescriptions Discharge Medication List as of 11/22/2015  8:59 PM    START taking these medications   Details  metroNIDAZOLE (FLAGYL) 500 MG tablet Take 1 tablet (500 mg total) by mouth 2 (two) times daily., Starting Thu 11/22/2015, Print         Joyce Copa [place], [place] 11/23/15 1610    Lyndal Pulley, MD 11/23/15 [phone]

## 2015-11-23 LAB — URINE CULTURE

## 2015-11-23 LAB — GC/CHLAMYDIA PROBE AMP (~~LOC~~) NOT AT ARMC
Chlamydia: NEGATIVE
NEISSERIA GONORRHEA: NEGATIVE

## 2015-11-23 LAB — HIV ANTIBODY (ROUTINE TESTING W REFLEX): HIV SCREEN 4TH GENERATION: NONREACTIVE

## 2015-11-23 LAB — RPR: RPR: NONREACTIVE

## 2016-03-14 ENCOUNTER — Encounter (HOSPITAL_BASED_OUTPATIENT_CLINIC_OR_DEPARTMENT_OTHER): Payer: Self-pay | Admitting: *Deleted

## 2016-03-14 ENCOUNTER — Emergency Department (HOSPITAL_BASED_OUTPATIENT_CLINIC_OR_DEPARTMENT_OTHER)
Admission: EM | Admit: 2016-03-14 | Discharge: 2016-03-14 | Disposition: A | Discharge status: Home / Self Care | Payer: Medicaid Other | Attending: Emergency Medicine | Admitting: Emergency Medicine

## 2016-03-14 DIAGNOSIS — I10 Essential (primary) hypertension: Secondary | ICD-10-CM | POA: Diagnosis not present

## 2016-03-14 DIAGNOSIS — F17210 Nicotine dependence, cigarettes, uncomplicated: Secondary | ICD-10-CM | POA: Insufficient documentation

## 2016-03-14 DIAGNOSIS — J029 Acute pharyngitis, unspecified: Secondary | ICD-10-CM | POA: Diagnosis present

## 2016-03-14 DIAGNOSIS — J020 Streptococcal pharyngitis: Secondary | ICD-10-CM | POA: Diagnosis not present

## 2016-03-14 LAB — RAPID STREP SCREEN (MED CTR MEBANE ONLY): Streptococcus, Group A Screen (Direct): POSITIVE — AB

## 2016-03-14 MED ORDER — PENICILLIN V POTASSIUM 250 MG PO TABS
500.0000 mg | ORAL_TABLET | Freq: Once | ORAL | Status: AC
  Administered 2016-03-14: 500 mg via ORAL
  Filled 2016-03-14: qty 2

## 2016-03-14 MED ORDER — IBUPROFEN 600 MG PO TABS: 600.0000 mg | ORAL_TABLET | Freq: Four times a day (QID) | ORAL | 0 refills | Status: DC | PRN

## 2016-03-14 MED ORDER — IBUPROFEN 400 MG PO TABS
600.0000 mg | ORAL_TABLET | Freq: Once | ORAL | Status: AC
  Administered 2016-03-14: 600 mg via ORAL
  Filled 2016-03-14: qty 1

## 2016-03-14 MED ORDER — DEXAMETHASONE 1 MG/ML PO CONC
10.0000 mg | Freq: Once | ORAL | Status: AC
  Administered 2016-03-14: 10 mg via ORAL
  Filled 2016-03-14: qty 10

## 2016-03-14 MED ORDER — PENICILLIN V POTASSIUM 500 MG PO TABS: 500.0000 mg | ORAL_TABLET | Freq: Four times a day (QID) | ORAL | 0 refills | Status: AC

## 2016-03-14 MED FILL — PENICILLIN VK 500 MG TABLET: 500 | 10 days supply | Qty: 40 | Fill #0

## 2016-03-14 MED FILL — IBUPROFEN 600 MG TABLET: 600 | 8 days supply | Qty: 30 | Fill #0

## 2016-03-14 NOTE — ED Provider Notes (Signed)
[facility]   CSN: [id number] Arrival date & time: 03/14/16  1019     History   Chief Complaint Chief Complaint  Patient presents with  . Sore Throat    HPI Courtney Vargas is a 20 y.o. female.  HPI Courtney BushSada Mitchum is a 20 y.o. female with history of hypertension, presents to emergency department complaining of sore throat. Patient states her symptoms began 2 days ago. She reports pain to her throat with swallowing, associated fever, chills, sweats. She states her temperature is 102 yesterday. She has taking ibuprofen for her fever and pain, last dose last night. She denies any nasal congestion. She denies any cough. She denies any sick contacts at home. She reports that is painful to swallow however she is able to swallow. Denies any voice changes. Denies neck pain or stiffness.  Past Medical History:  Diagnosis Date  . GERD (gastroesophageal reflux disease)   . Hypertension    states this has resolved  . Prediabetes    states this has resolved    Patient Active Problem List   Diagnosis Date Noted  . PROM (premature rupture of membranes) 10/22/2015    Past Surgical History:  Procedure Laterality Date  . CESAREAN SECTION N/A 10/23/2015   Procedure: CESAREAN SECTION;  Surgeon: Lesly DukesKelly H Leggett, MD;  [facility];  Service: Obstetrics;  Laterality: N/A;  . NO PAST SURGERIES      OB History    Gravida Para Term Preterm AB Living   1 1 1     1    SAB TAB Ectopic Multiple Live Births         0 1       Home Medications    Prior to Admission medications   Medication Sig Start Date End Date Taking? Authorizing Provider  metroNIDAZOLE (FLAGYL) 500 MG tablet Take 1 tablet (500 mg total) by mouth 2 (two) times daily. 11/22/15   Jerre SimonJessica L Focht, PA  Prenatal Vit-Fe Fumarate-FA (PRENATAL MULTIVITAMIN) TABS tablet Take 1 tablet by mouth daily at 12 noon.    Historical Provider, MD    Family History Family History  Problem Relation Age of  Onset  . Hypertension Mother   . Diabetes Maternal Grandmother   . Hypertension Maternal Grandmother   . Diabetes Maternal Grandfather   . Hypertension Maternal Grandfather   . Diabetes Paternal Grandmother   . Hypertension Paternal Grandmother   . Diabetes Paternal Grandfather   . Hypertension Paternal Grandfather     Social History Social History  Substance Use Topics  . Smoking status: Current Every Day Smoker    Packs/day: 0.50    Types: Cigarettes  . Smokeless tobacco: Never Used  . Alcohol use No     Allergies   Patient has no known allergies.   Review of Systems Review of Systems  Constitutional: Positive for chills and fever.  HENT: Positive for sore throat. Negative for congestion, mouth sores, trouble swallowing and voice change.   Respiratory: Negative for cough, chest tightness and shortness of breath.   Cardiovascular: Negative for chest pain, palpitations and leg swelling.  Gastrointestinal: Negative for abdominal pain, diarrhea, nausea and vomiting.  Musculoskeletal: Negative for arthralgias, myalgias, neck pain and neck stiffness.  Skin: Negative for rash.  Neurological: Negative for dizziness, weakness and headaches.  All other systems reviewed and are negative.    Physical Exam Updated Vital Signs BP 130/77 (BP Location: Left Arm)   Pulse 98   Temp 98.5 F (36.9 C) (  Oral)   Resp 16   LMP 02/29/2016 (Approximate)   SpO2 100%   Breastfeeding? No   Physical Exam  Constitutional: She appears well-developed and well-nourished. No distress.  HENT:  Head: Normocephalic.  Right Ear: Tympanic membrane, external ear and ear canal normal.  Left Ear: Tympanic membrane, external ear and ear canal normal.  Nose: Nose normal.  Mouth/Throat: Uvula is midline and mucous membranes are normal. Posterior oropharyngeal erythema present. No oropharyngeal exudate or tonsillar abscesses. Tonsils are 2+ on the right. Tonsils are 2+ on the left. No tonsillar  exudate.  Tonsils enlarged and erythematous bilaterally. No exudate. Uvula is midline.  Eyes: Conjunctivae are normal.  Neck: Neck supple.  Cardiovascular: Normal rate, regular rhythm and normal heart sounds.   Pulmonary/Chest: Effort normal and breath sounds normal. No respiratory distress. She has no wheezes. She has no rales.  Abdominal: Soft. Bowel sounds are normal. She exhibits no distension. There is no tenderness. There is no rebound.  Musculoskeletal: She exhibits no edema.  Neurological: She is alert.  Skin: Skin is warm and dry.  Psychiatric: She has a normal mood and affect. Her behavior is normal.  Nursing note and vitals reviewed.    ED Treatments / Results  Labs (all labs ordered are listed, but only abnormal results are displayed) Labs Reviewed  RAPID STREP SCREEN (NOT [facility]) - Abnormal; Notable for the following:       Result Value   Streptococcus, Group A Screen (Direct) POSITIVE (*)    All other components within normal limits    EKG  EKG Interpretation None       Radiology No results found.  Procedures Procedures (including critical care time)  Medications Ordered in ED Medications  dexamethasone (DECADRON) 1 MG/ML solution 10 mg (10 mg Oral Given 03/14/16 1143)  penicillin v potassium (VEETID) tablet 500 mg (500 mg Oral Given 03/14/16 1142)  ibuprofen (ADVIL,MOTRIN) tablet 600 mg (600 mg Oral Given 03/14/16 1142)     Initial Impression / Assessment and Plan / ED Course  I have reviewed the triage vital signs and the nursing notes.  Pertinent labs & imaging results that were available during my care of the patient were reviewed by me and considered in my medical decision making (see chart for details).  Clinical Course     Patient emergency department with sore throat for 2 days. Fever 102 yesterday, afebrile here. Patient has no evidence of peritonsillar or retropharyngeal abscess on exam. Uvula is midline. Tonsils are 2+ bilaterally. No  muffled voice. She swallowing in emergency department. Strep screen is positive. Offered treatment with Bicillin IM which she declined. Home with penicillin, given 10 mg of Decadron by mouth in emergency department. Advised to do Chloraseptic throat sprays, saltwater gargles, continue ibuprofen and Tylenol. Patient agreed. Return precautions discussed.   Vitals:   03/14/16 1032  BP: 130/77  Pulse: 98  Resp: 16  Temp: 98.5 F (36.9 C)  TempSrc: Oral  SpO2: 100%      Final Clinical Impressions(s) / ED Diagnoses   Final diagnoses:  Strep pharyngitis    New Prescriptions New Prescriptions   IBUPROFEN (ADVIL,MOTRIN) 600 MG TABLET    Take 1 tablet (600 mg total) by mouth every 6 (six) hours as needed.   PENICILLIN V POTASSIUM (VEETID) 500 MG TABLET    Take 1 tablet (500 mg total) by mouth 4 (four) times daily.     Jaynie Crumbleatyana Berlie Hatchel, PA-C 03/14/16 1152    Jerelyn ScottMartha Linker, MD 03/14/16 [phone]

## 2016-03-14 NOTE — ED Triage Notes (Signed)
Fever, sore throat, difficulty swallowing, muffled voice. Sx since yesterday

## 2016-03-14 NOTE — Discharge Instructions (Signed)
Take penicillin as prescribed until all gone. Take ibuprofen and Tylenol for any fever and pain. Try Chloraseptic throat spray for pain. Do salt water gargles multiple times a day. Follow-up with the family doctor if not improving. Return if worsening symptoms

## 2016-06-18 ENCOUNTER — Emergency Department (HOSPITAL_COMMUNITY)
Admission: EM | Admit: 2016-06-18 | Discharge: 2016-06-18 | Disposition: A | Discharge status: Home / Self Care | Payer: Medicaid Other | Attending: Emergency Medicine | Admitting: Emergency Medicine

## 2016-06-18 ENCOUNTER — Encounter (HOSPITAL_COMMUNITY): Payer: Self-pay | Admitting: Emergency Medicine

## 2016-06-18 ENCOUNTER — Emergency Department (HOSPITAL_COMMUNITY): Payer: Medicaid Other

## 2016-06-18 DIAGNOSIS — Y999 Unspecified external cause status: Secondary | ICD-10-CM | POA: Diagnosis not present

## 2016-06-18 DIAGNOSIS — I10 Essential (primary) hypertension: Secondary | ICD-10-CM | POA: Diagnosis not present

## 2016-06-18 DIAGNOSIS — R51 Headache: Secondary | ICD-10-CM | POA: Diagnosis not present

## 2016-06-18 DIAGNOSIS — Z79899 Other long term (current) drug therapy: Secondary | ICD-10-CM | POA: Insufficient documentation

## 2016-06-18 DIAGNOSIS — Y92410 Unspecified street and highway as the place of occurrence of the external cause: Secondary | ICD-10-CM | POA: Diagnosis not present

## 2016-06-18 DIAGNOSIS — S2002XA Contusion of left breast, initial encounter: Secondary | ICD-10-CM | POA: Diagnosis not present

## 2016-06-18 DIAGNOSIS — Y939 Activity, unspecified: Secondary | ICD-10-CM | POA: Diagnosis not present

## 2016-06-18 DIAGNOSIS — R1010 Upper abdominal pain, unspecified: Secondary | ICD-10-CM | POA: Diagnosis not present

## 2016-06-18 DIAGNOSIS — F17210 Nicotine dependence, cigarettes, uncomplicated: Secondary | ICD-10-CM | POA: Diagnosis not present

## 2016-06-18 DIAGNOSIS — S299XXA Unspecified injury of thorax, initial encounter: Secondary | ICD-10-CM | POA: Diagnosis present

## 2016-06-18 DIAGNOSIS — M25561 Pain in right knee: Secondary | ICD-10-CM

## 2016-06-18 LAB — I-STAT CHEM 8, ED
BUN: 11 mg/dL (ref 6–20)
CHLORIDE: 103 mmol/L (ref 101–111)
Calcium, Ion: 1.2 mmol/L (ref 1.15–1.40)
Creatinine, Ser: 0.7 mg/dL (ref 0.44–1.00)
Glucose, Bld: 91 mg/dL (ref 65–99)
HEMATOCRIT: 39 % (ref 36.0–46.0)
Hemoglobin: 13.3 g/dL (ref 12.0–15.0)
POTASSIUM: 3.6 mmol/L (ref 3.5–5.1)
SODIUM: 141 mmol/L (ref 135–145)
TCO2: 26 mmol/L (ref 0–100)

## 2016-06-18 LAB — I-STAT BETA HCG BLOOD, ED (MC, WL, AP ONLY): I-stat hCG, quantitative: <5 m[IU]/mL (ref <5)

## 2016-06-18 MED ORDER — SODIUM CHLORIDE 0.9 % IV BOLUS (SEPSIS)
1000.0000 mL | Freq: Once | INTRAVENOUS | Status: AC
  Administered 2016-06-18: 1000 mL via INTRAVENOUS

## 2016-06-18 MED ORDER — IBUPROFEN 800 MG PO TABS: 800.0000 mg | ORAL_TABLET | Freq: Three times a day (TID) | ORAL | 0 refills | Status: AC | PRN

## 2016-06-18 MED ORDER — ONDANSETRON HCL 4 MG/2ML IJ SOLN
4.0000 mg | Freq: Once | INTRAMUSCULAR | Status: AC
  Administered 2016-06-18: 4 mg via INTRAVENOUS
  Filled 2016-06-18: qty 2

## 2016-06-18 MED ORDER — OXYCODONE-ACETAMINOPHEN 5-325 MG PO TABS: 1.0000 | ORAL_TABLET | Freq: Four times a day (QID) | ORAL | 0 refills | Status: AC | PRN

## 2016-06-18 MED ORDER — FENTANYL CITRATE (PF) 100 MCG/2ML IJ SOLN
100.0000 ug | Freq: Once | INTRAMUSCULAR | Status: AC
  Administered 2016-06-18: 100 ug via INTRAVENOUS
  Filled 2016-06-18: qty 2

## 2016-06-18 MED ORDER — IOPAMIDOL (ISOVUE-300) INJECTION 61%
INTRAVENOUS | Status: AC
  Administered 2016-06-18: 100 mL
  Filled 2016-06-18: qty 100
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
## 2016-06-18 NOTE — Discharge Instructions (Signed)
To find a primary care or specialty doctor please call [phone] or [phone] to access "[facility] Find a Doctor Service." ° °You may also go on the [facility] website at [email or web address] ° °There are also multiple [facility], [facility], [facility] and [facility] practices throughout the [place] that are frequently accepting new patients. You may find a clinic that is close to your home and contact them. ° °[facility] -  °[address] °[place] [address] °[phone] ° ° °[facility] -  °[address] °[place] [address] [phone] ° ° °[facility] - °[address]  °[place] [address] °[phone] ° ° °
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
## 2017-04-09 ENCOUNTER — Encounter (HOSPITAL_COMMUNITY): Payer: Self-pay | Admitting: *Deleted

## 2017-04-09 ENCOUNTER — Inpatient Hospital Stay (HOSPITAL_COMMUNITY)
Admission: AD | Admit: 2017-04-09 | Discharge: 2017-04-09 | Disposition: A | Discharge status: Home / Self Care | Payer: Medicaid Other | Source: Ambulatory Visit | Attending: Obstetrics and Gynecology | Admitting: Obstetrics and Gynecology

## 2017-04-09 DIAGNOSIS — O99333 Smoking (tobacco) complicating pregnancy, third trimester: Secondary | ICD-10-CM | POA: Insufficient documentation

## 2017-04-09 DIAGNOSIS — O133 Gestational [pregnancy-induced] hypertension without significant proteinuria, third trimester: Secondary | ICD-10-CM

## 2017-04-09 DIAGNOSIS — Z3A38 38 weeks gestation of pregnancy: Secondary | ICD-10-CM

## 2017-04-09 DIAGNOSIS — O34219 Maternal care for unspecified type scar from previous cesarean delivery: Secondary | ICD-10-CM | POA: Diagnosis not present

## 2017-04-09 DIAGNOSIS — F17210 Nicotine dependence, cigarettes, uncomplicated: Secondary | ICD-10-CM | POA: Diagnosis not present

## 2017-04-09 DIAGNOSIS — R109 Unspecified abdominal pain: Secondary | ICD-10-CM | POA: Diagnosis present

## 2017-04-09 LAB — CBC
HEMATOCRIT: 28.6 % — AB (ref 36.0–46.0)
HEMOGLOBIN: 10.3 g/dL — AB (ref 12.0–15.0)
MCH: 30.4 pg (ref 26.0–34.0)
MCHC: 36 g/dL (ref 30.0–36.0)
MCV: 84.4 fL (ref 78.0–100.0)
Platelets: 214 10*3/uL (ref 150–400)
RBC: 3.39 MIL/uL — AB (ref 3.87–5.11)
RDW: 12.3 % (ref 11.5–15.5)
WBC: 7 10*3/uL (ref 4.0–10.5)

## 2017-04-09 LAB — URINALYSIS, ROUTINE W REFLEX MICROSCOPIC
Bilirubin Urine: NEGATIVE
GLUCOSE, UA: NEGATIVE mg/dL
Hgb urine dipstick: NEGATIVE
Ketones, ur: NEGATIVE mg/dL
LEUKOCYTES UA: NEGATIVE
Nitrite: NEGATIVE
PH: 6 (ref 5.0–8.0)
Protein, ur: NEGATIVE mg/dL
Specific Gravity, Urine: 1.019 (ref 1.005–1.030)

## 2017-04-09 LAB — COMPREHENSIVE METABOLIC PANEL
ALK PHOS: 77 U/L (ref 38–126)
ALT: 7 U/L — AB (ref 14–54)
AST: 15 U/L (ref 15–41)
Albumin: 3 g/dL — ABNORMAL LOW (ref 3.5–5.0)
Anion gap: 7 (ref 5–15)
BILIRUBIN TOTAL: 0.2 mg/dL — AB (ref 0.3–1.2)
BUN: 7 mg/dL (ref 6–20)
CALCIUM: 8.5 mg/dL — AB (ref 8.9–10.3)
CO2: 21 mmol/L — AB (ref 22–32)
CREATININE: 0.47 mg/dL (ref 0.44–1.00)
Chloride: 107 mmol/L (ref 101–111)
GFR calc non Af Amer: >60 mL/min (ref >60)
GLUCOSE: 77 mg/dL (ref 65–99)
Potassium: 3.4 mmol/L — ABNORMAL LOW (ref 3.5–5.1)
SODIUM: 135 mmol/L (ref 135–145)
TOTAL PROTEIN: 6.4 g/dL — AB (ref 6.5–8.1)

## 2017-04-09 LAB — PROTEIN / CREATININE RATIO, URINE
Creatinine, Urine: 183 mg/dL
Protein Creatinine Ratio: 0.09 mg/mg{Cre} (ref 0.00–0.15)
TOTAL PROTEIN, URINE: 17 mg/dL

## 2017-04-09 MED ORDER — BUTALBITAL-APAP-CAFFEINE 50-325-40 MG PO TABS
2.0000 | ORAL_TABLET | Freq: Once | ORAL | Status: AC
  Administered 2017-04-09: 2 via ORAL
  Filled 2017-04-09: qty 2

## 2017-04-09 NOTE — MAU Note (Addendum)
Patient c/o  +back pain and pressure Rating pain 8/10 Has been ongoing since Monday  +diarrhea  +headache Rating pain 6/10 With floaters  Denies epigastric pain or swelling at this time.   Reports being monitored in [place] for blood pressure this pregnancy; states had elevated BP prior to pregnancy.  Endorses being 1cm and thick at last visit this week. Also states is scheduled for a repeat c/s on 12/26.  States that she has been having these problems and has gotten since home each time from [place]; seeking a second opinion here.  occasional uc Denies LOF or VB  +FM

## 2017-04-09 NOTE — Discharge Instructions (Signed)
Braxton Hicks Contractions °Contractions of the uterus can occur throughout pregnancy, but they are not always a sign that you are in labor. You may have practice contractions called Braxton Hicks contractions. These false labor contractions are sometimes confused with true labor. °What are Braxton Hicks contractions? °Braxton Hicks contractions are tightening movements that occur in the muscles of the uterus before labor. Unlike true labor contractions, these contractions do not result in opening (dilation) and thinning of the cervix. Toward the end of pregnancy (32-34 weeks), Braxton Hicks contractions can happen more often and may become stronger. These contractions are sometimes difficult to tell apart from true labor because they can be very uncomfortable. You should not feel embarrassed if you go to the hospital with false labor. °Sometimes, the only way to tell if you are in true labor is for your health care provider to look for changes in the cervix. The health care provider will do a physical exam and may monitor your contractions. If you are not in true labor, the exam should show that your cervix is not dilating and your water has not broken. °If there are other health problems associated with your pregnancy, it is completely safe for you to be sent home with false labor. You may continue to have Braxton Hicks contractions until you go into true labor. °How to tell the difference between true labor and false labor °True labor °· Contractions last 30-70 seconds. °· Contractions become very regular. °· Discomfort is usually felt in the top of the uterus, and it spreads to the lower abdomen and low back. °· Contractions do not go away with walking. °· Contractions usually become more intense and increase in frequency. °· The cervix dilates and gets thinner. °False labor °· Contractions are usually shorter and not as strong as true labor contractions. °· Contractions are usually irregular. °· Contractions  are often felt in the front of the lower abdomen and in the groin. °· Contractions may go away when you walk around or change positions while lying down. °· Contractions get weaker and are shorter-lasting as time goes on. °· The cervix usually does not dilate or become thin. °Follow these instructions at home: °· Take over-the-counter and prescription medicines only as told by your health care provider. °· Keep up with your usual exercises and follow other instructions from your health care provider. °· Eat and drink lightly if you think you are going into labor. °· If Braxton Hicks contractions are making you uncomfortable: °? Change your position from lying down or resting to walking, or change from walking to resting. °? Sit and rest in a tub of warm water. °? Drink enough fluid to keep your urine pale yellow. Dehydration may cause these contractions. °? Do slow and deep breathing several times an hour. °· Keep all follow-up prenatal visits as told by your health care provider. This is important. °Contact a health care provider if: °· You have a fever. °· You have continuous pain in your abdomen. °Get help right away if: °· Your contractions become stronger, more regular, and closer together. °· You have fluid leaking or gushing from your vagina. °· You pass blood-tinged mucus (bloody show). °· You have bleeding from your vagina. °· You have low back pain that you never had before. °· You feel your baby’s head pushing down and causing pelvic pressure. °· Your baby is not moving inside you as much as it used to. °Summary °· Contractions that occur before labor are called Braxton   Hicks contractions, false labor, or practice contractions. °· Braxton Hicks contractions are usually shorter, weaker, farther apart, and less regular than true labor contractions. True labor contractions usually become progressively stronger and regular and they become more frequent. °· Manage discomfort from Braxton Hicks contractions by  changing position, resting in a warm bath, drinking plenty of water, or practicing deep breathing. °This information is not intended to replace advice given to you by your health care provider. Make sure you discuss any questions you have with your health care provider. °Document Released: 08/21/2016 Document Revised: 08/21/2016 Document Reviewed: 08/21/2016 °Elsevier Interactive Patient Education © 2018 [employer] ° °

## 2017-04-09 NOTE — MAU Provider Note (Cosign Needed)
History     CSN: [id number]  Arrival date and time: 04/09/17 [id number]   None     Chief Complaint  Patient presents with  . Back Pain  . Headache   21 yo G2P1001 at 6350w2d presents with occasional contractions and pelvic "pressure." [facility]. Her pregnancy is complicated by gHTN for which she does not take medication and previous c-section (scheduled for repeat 12/26/128). Patient also admits to headache for which she has taken tylenol and this helps for a while. She says her "eyes hurt" but denies vision changes. Denies RUQ pain. Patient has been seen for the same at [facility] several times over the past 1 week.  Patient wants to know if she can be induced today.     Past Medical History:  Diagnosis Date  . GERD (gastroesophageal reflux disease)   . Hypertension    states this has resolved  . Prediabetes    states this has resolved    Past Surgical History:  Procedure Laterality Date  . CESAREAN SECTION N/A 10/23/2015   Procedure: CESAREAN SECTION;  Surgeon: Lesly DukesKelly H Leggett, MD;  [facility];  Service: Obstetrics;  Laterality: N/A;  . NO PAST SURGERIES      Family History  Problem Relation Age of Onset  . Hypertension Mother   . Diabetes Maternal Grandmother   . Hypertension Maternal Grandmother   . Diabetes Maternal Grandfather   . Hypertension Maternal Grandfather   . Diabetes Paternal Grandmother   . Hypertension Paternal Grandmother   . Diabetes Paternal Grandfather   . Hypertension Paternal Grandfather     Social History   Tobacco Use  . Smoking status: Current Every Day Smoker    Packs/day: 0.50    Types: Cigarettes  . Smokeless tobacco: Never Used  Substance Use Topics  . Alcohol use: No  . Drug use: Yes    Types: Marijuana    Allergies: No Known Allergies  Medications Prior to Admission  Medication Sig Dispense Refill Last Dose  . ibuprofen (ADVIL,MOTRIN) 800 MG tablet Take 1 tablet  (800 mg total) by mouth every 8 (eight) hours as needed for mild pain. 30 tablet 0   . oxyCODONE-acetaminophen (PERCOCET/ROXICET) 5-325 MG tablet Take 1-2 tablets by mouth every 6 (six) hours as needed. 15 tablet 0     Review of Systems  Constitutional: Negative for chills and fatigue.  HENT: Negative for congestion and dental problem.   Eyes: Positive for pain. Negative for visual disturbance.  Respiratory: Negative for apnea and chest tightness.   Cardiovascular: Negative for chest pain and leg swelling.  Gastrointestinal: Negative for abdominal pain and vomiting.  Endocrine: Negative for cold intolerance and heat intolerance.  Genitourinary: Negative for urgency and vaginal bleeding.  Musculoskeletal: Positive for back pain. Negative for arthralgias.  Neurological: Positive for headaches. Negative for dizziness.  Hematological: Negative for adenopathy. Does not bruise/bleed easily.   Physical Exam   Blood pressure 123/68, pulse 89, temperature 97.9 F (36.6 C), temperature source Oral, resp. rate 17, height 5\' 2"  (1.575 m), weight 236 lb 0.6 oz (107.1 kg), last menstrual period 05/22/2016, SpO2 100 %, not currently breastfeeding.  Physical Exam  Constitutional: She is oriented to person, place, and time. She appears well-developed and well-nourished.  HENT:  Head: Normocephalic and atraumatic.  Eyes: Conjunctivae are normal. Pupils are equal, round, and reactive to light.  Neck: Normal range of motion. Neck supple.  Cardiovascular: Normal rate and intact distal  pulses.  Respiratory: Effort normal. No respiratory distress.  GI: Soft. There is no tenderness.  Genitourinary:  Genitourinary Comments: Cervical exam: performed by nursing staff. 1/thick/high  Musculoskeletal: Normal range of motion. She exhibits no edema.  Neurological: She is alert and oriented to person, place, and time.  Skin: Skin is warm and dry.  Psychiatric: She has a normal mood and affect. Her behavior is  normal.    MAU Course  Procedures  MDM Blood pressures are wnl today. Check previous records at [place] and she did have systolic of 152 and diastolic of 94 on 2 separate occasions. UA showed trace protein the the recent past.CBC, cmp, and pr/cr pending. Continue to monitor blood pressure.Fioricet for headache.  Assessment and Plan  1. Pregnancy at 38 weeks completed gestation- follow up with regular OB provider as scheduled 2. Gestational HTN- labs wnl and blood pressure wnl today  Courtney Vargas 04/09/2017, 9:28 AM

## 2017-07-14 ENCOUNTER — Encounter (HOSPITAL_COMMUNITY): Payer: Self-pay
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
## 2021-06-02 ENCOUNTER — Other Ambulatory Visit: Payer: Self-pay

## 2021-06-02 ENCOUNTER — Emergency Department (HOSPITAL_BASED_OUTPATIENT_CLINIC_OR_DEPARTMENT_OTHER)
Admission: EM | Admit: 2021-06-02 | Discharge: 2021-06-02 | Disposition: A | Discharge status: Home / Self Care | Payer: No Typology Code available for payment source | Attending: Emergency Medicine | Admitting: Emergency Medicine

## 2021-06-02 ENCOUNTER — Encounter (HOSPITAL_BASED_OUTPATIENT_CLINIC_OR_DEPARTMENT_OTHER): Payer: Self-pay | Admitting: *Deleted

## 2021-06-02 ENCOUNTER — Emergency Department (HOSPITAL_BASED_OUTPATIENT_CLINIC_OR_DEPARTMENT_OTHER): Payer: No Typology Code available for payment source

## 2021-06-02 DIAGNOSIS — S40011A Contusion of right shoulder, initial encounter: Secondary | ICD-10-CM | POA: Diagnosis not present

## 2021-06-02 DIAGNOSIS — S4991XA Unspecified injury of right shoulder and upper arm, initial encounter: Secondary | ICD-10-CM | POA: Diagnosis present

## 2021-06-02 DIAGNOSIS — Y92410 Unspecified street and highway as the place of occurrence of the external cause: Secondary | ICD-10-CM | POA: Diagnosis not present

## 2021-06-02 MED ORDER — ACETAMINOPHEN 325 MG PO TABS
650.0000 mg | ORAL_TABLET | Freq: Once | ORAL | Status: AC
  Administered 2021-06-02: 650 mg via ORAL
  Filled 2021-06-02: qty 2
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
## 2021-06-02 NOTE — Discharge Instructions (Signed)
Overall suspect you have a bone bruise to your right shoulder.  Recommend Tylenol and ibuprofen and ice for pain.  Use sling for comfort.
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
## 2022-10-03 ENCOUNTER — Encounter (HOSPITAL_BASED_OUTPATIENT_CLINIC_OR_DEPARTMENT_OTHER): Payer: Self-pay | Admitting: Emergency Medicine

## 2022-10-03 DIAGNOSIS — S025XXA Fracture of tooth (traumatic), initial encounter for closed fracture: Secondary | ICD-10-CM | POA: Insufficient documentation

## 2022-10-03 DIAGNOSIS — Z5321 Procedure and treatment not carried out due to patient leaving prior to being seen by health care provider: Secondary | ICD-10-CM | POA: Insufficient documentation

## 2022-10-03 DIAGNOSIS — S0081XA Abrasion of other part of head, initial encounter: Secondary | ICD-10-CM | POA: Insufficient documentation
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
## 2022-10-04 ENCOUNTER — Emergency Department (HOSPITAL_BASED_OUTPATIENT_CLINIC_OR_DEPARTMENT_OTHER): Payer: Self-pay

## 2022-10-04 ENCOUNTER — Other Ambulatory Visit: Payer: Self-pay

## 2022-10-04 ENCOUNTER — Emergency Department (HOSPITAL_BASED_OUTPATIENT_CLINIC_OR_DEPARTMENT_OTHER)
Admission: EM | Admit: 2022-10-04 | Discharge: 2022-10-04 | Discharge status: Against Medical Advice | Payer: Self-pay | Attending: Emergency Medicine | Admitting: Emergency Medicine

## 2022-10-04 ENCOUNTER — Encounter (HOSPITAL_BASED_OUTPATIENT_CLINIC_OR_DEPARTMENT_OTHER): Payer: Self-pay | Admitting: Emergency Medicine

## 2022-10-04 DIAGNOSIS — S0181XA Laceration without foreign body of other part of head, initial encounter: Secondary | ICD-10-CM | POA: Insufficient documentation

## 2022-10-04 DIAGNOSIS — Z5329 Procedure and treatment not carried out because of patient's decision for other reasons: Secondary | ICD-10-CM | POA: Insufficient documentation

## 2022-10-04 MED ORDER — TETANUS-DIPHTH-ACELL PERTUSSIS 5-2.5-18.5 LF-MCG/0.5 IM SUSY
0.5000 mL | PREFILLED_SYRINGE | Freq: Once | INTRAMUSCULAR | Status: DC
  Filled 2022-10-04: qty 0.5

## 2022-10-04 MED ORDER — LIDOCAINE-EPINEPHRINE (PF) 2 %-1:200000 IJ SOLN
10.0000 mL | Freq: Once | INTRAMUSCULAR | Status: AC
  Administered 2022-10-04: 10 mL
  Filled 2022-10-04: qty 20

## 2022-10-04 NOTE — ED Notes (Signed)
Pt transported to CT ?

## 2022-10-04 NOTE — ED Notes (Signed)
Wound flushed and cleaned with NS
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
## 2022-10-04 NOTE — ED Notes (Signed)
Called Patient x1 to be roomed No answer

## 2022-10-04 NOTE — ED Notes (Signed)
Pt stated her last Tdap was oct of 2023 in jail.
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
## 2022-10-04 NOTE — ED Notes (Signed)
Patient called by radiology staff as well as our staff to be roomed.  Patient has not answered x3
